# Patient Record
Sex: Female | Born: 1970 | Race: Black or African American | Hispanic: No | Smoking: Former smoker
Health system: Southern US, Community
[De-identification: ages and names within clinical notes are randomized; demographics above are authoritative.]

## PROBLEM LIST (undated history)

## (undated) DIAGNOSIS — N840 Polyp of corpus uteri: Secondary | ICD-10-CM

## (undated) DIAGNOSIS — D649 Anemia, unspecified: Secondary | ICD-10-CM

## (undated) DIAGNOSIS — D219 Benign neoplasm of connective and other soft tissue, unspecified: Secondary | ICD-10-CM

## (undated) DIAGNOSIS — R9389 Abnormal findings on diagnostic imaging of other specified body structures: Secondary | ICD-10-CM

## (undated) DIAGNOSIS — N920 Excessive and frequent menstruation with regular cycle: Secondary | ICD-10-CM

## (undated) HISTORY — PX: KNEE SURGERY: SHX244

## (undated) HISTORY — DX: Abnormal findings on diagnostic imaging of other specified body structures: R93.89

## (undated) HISTORY — PX: WISDOM TOOTH EXTRACTION: SHX21

## (undated) HISTORY — DX: Polyp of corpus uteri: N84.0

## (undated) HISTORY — DX: Anemia, unspecified: D64.9

## (undated) HISTORY — DX: Benign neoplasm of connective and other soft tissue, unspecified: D21.9

## (undated) HISTORY — DX: Excessive and frequent menstruation with regular cycle: N92.0

## (undated) HISTORY — PX: TUBAL LIGATION: SHX77

---

## 2003-07-25 ENCOUNTER — Emergency Department (HOSPITAL_COMMUNITY): Admission: EM | Admit: 2003-07-25 | Discharge: 2003-07-25 | Payer: Self-pay | Admitting: Emergency Medicine

## 2004-06-14 ENCOUNTER — Emergency Department (HOSPITAL_COMMUNITY): Admission: EM | Admit: 2004-06-14 | Discharge: 2004-06-14 | Payer: Self-pay | Admitting: Emergency Medicine

## 2005-05-31 ENCOUNTER — Emergency Department (HOSPITAL_COMMUNITY): Admission: EM | Admit: 2005-05-31 | Discharge: 2005-05-31 | Payer: Self-pay | Admitting: Emergency Medicine

## 2010-04-08 ENCOUNTER — Other Ambulatory Visit: Admission: RE | Admit: 2010-04-08 | Discharge: 2010-04-08 | Payer: Self-pay | Admitting: Obstetrics and Gynecology

## 2010-04-10 ENCOUNTER — Ambulatory Visit (HOSPITAL_COMMUNITY): Admission: RE | Admit: 2010-04-10 | Discharge: 2010-04-10 | Payer: Self-pay | Admitting: Obstetrics and Gynecology

## 2012-02-05 ENCOUNTER — Other Ambulatory Visit: Payer: Self-pay | Admitting: Obstetrics and Gynecology

## 2012-02-05 ENCOUNTER — Other Ambulatory Visit (HOSPITAL_COMMUNITY): Payer: Self-pay | Admitting: *Deleted

## 2012-02-05 DIAGNOSIS — Z1231 Encounter for screening mammogram for malignant neoplasm of breast: Secondary | ICD-10-CM

## 2012-02-08 ENCOUNTER — Ambulatory Visit (HOSPITAL_COMMUNITY)
Admission: RE | Admit: 2012-02-08 | Discharge: 2012-02-08 | Disposition: A | Discharge status: Home / Self Care | Payer: Self-pay | Source: Ambulatory Visit | Attending: Obstetrics and Gynecology | Admitting: Obstetrics and Gynecology

## 2012-02-08 DIAGNOSIS — Z1231 Encounter for screening mammogram for malignant neoplasm of breast: Secondary | ICD-10-CM

## 2013-10-08 ENCOUNTER — Emergency Department (HOSPITAL_COMMUNITY)
Admission: EM | Admit: 2013-10-08 | Discharge: 2013-10-08 | Disposition: A | Discharge status: Home / Self Care | Payer: Self-pay | Attending: Emergency Medicine | Admitting: Emergency Medicine

## 2013-10-08 ENCOUNTER — Encounter (HOSPITAL_COMMUNITY): Payer: Self-pay | Admitting: Emergency Medicine

## 2013-10-08 DIAGNOSIS — Z87891 Personal history of nicotine dependence: Secondary | ICD-10-CM | POA: Insufficient documentation

## 2013-10-08 DIAGNOSIS — J069 Acute upper respiratory infection, unspecified: Secondary | ICD-10-CM | POA: Insufficient documentation

## 2013-10-08 NOTE — Discharge Instructions (Signed)
Antibiotic Nonuse   Your caregiver felt that the infection or problem was not one that would be helped with an antibiotic.  Infections may be caused by viruses or bacteria. Only a caregiver can tell which one of these is the likely cause of an illness. A cold is the most common cause of infection in both adults and children. A cold is a virus. Antibiotic treatment will have no effect on a viral infection. Viruses can lead to many lost days of work caring for sick children and many missed days of school. Children may catch as many as 10 "colds" or "flus" per year during which they can be tearful, cranky, and uncomfortable. The goal of treating a virus is aimed at keeping the ill person comfortable.  Antibiotics are medications used to help the body fight bacterial infections. There are relatively few types of bacteria that cause infections but there are hundreds of viruses. While both viruses and bacteria cause infection they are very different types of germs. A viral infection will typically go away by itself within 7 to 10 days. Bacterial infections may spread or get worse without antibiotic treatment.  Examples of bacterial infections are:   Sore throats (like strep throat or tonsillitis).   Infection in the lung (pneumonia).   Ear and skin infections.  Examples of viral infections are:   Colds or flus.   Most coughs and bronchitis.   Sore throats not caused by Strep.   Runny noses.  It is often best not to take an antibiotic when a viral infection is the cause of the problem. Antibiotics can kill off the helpful bacteria that we have inside our body and allow harmful bacteria to start growing. Antibiotics can cause side effects such as allergies, nausea, and diarrhea without helping to improve the symptoms of the viral infection. Additionally, repeated uses of antibiotics can cause bacteria inside of our body to become resistant. That resistance can be passed onto harmful bacterial. The next time you have  an infection it may be harder to treat if antibiotics are used when they are not needed. Not treating with antibiotics allows our own immune system to develop and take care of infections more efficiently. Also, antibiotics will work better for us when they are prescribed for bacterial infections.  Treatments for a child that is ill may include:   Give extra fluids throughout the day to stay hydrated.   Get plenty of rest.   Only give your child over-the-counter or prescription medicines for pain, discomfort, or fever as directed by your caregiver.   The use of a cool mist humidifier may help stuffy noses.   Cold medications if suggested by your caregiver.  Your caregiver may decide to start you on an antibiotic if:   The problem you were seen for today continues for a longer length of time than expected.   You develop a secondary bacterial infection.  SEEK MEDICAL CARE IF:   Fever lasts longer than 5 days.   Symptoms continue to get worse after 5 to 7 days or become severe.   Difficulty in breathing develops.   Signs of dehydration develop (poor drinking, rare urinating, dark colored urine).   Changes in behavior or worsening tiredness (listlessness or lethargy).  Document Released: 11/02/2001 Document Revised: 11/16/2011 Document Reviewed: 05/01/2009  [employer] Patient Information 2014 [employer].

## 2013-10-08 NOTE — ED Notes (Signed)
Cough, sore throat, chills for 4 days. Worried that she has either "walking pneumonia or strep"

## 2013-10-08 NOTE — ED Provider Notes (Signed)
CSN: [id number]     Arrival date & time 10/08/13  0619 History   First MD Initiated Contact with Patient 10/08/13 [phone]     Chief Complaint  Patient presents with  . Sore Throat    Patient is a 43 y.o. female presenting with pharyngitis. The history is provided by the patient.  Sore Throat This is a new problem. The current episode started more than 2 days ago. The problem occurs daily. The problem has been gradually worsening. Pertinent negatives include no chest pain and no shortness of breath. The symptoms are aggravated by coughing. The symptoms are relieved by rest.    PMH - none History reviewed. No pertinent past surgical history. No family history on file. History  Substance Use Topics  . Smoking status: Former Smoker    Quit date: 10/21/2010  . Smokeless tobacco: Not on file  . Alcohol Use: Not on file   OB History   Grav Para Term Preterm Abortions TAB SAB Ect Mult Living                 Review of Systems  Constitutional: Positive for chills. Negative for fever.  Respiratory: Negative for shortness of breath.   Cardiovascular: Negative for chest pain.  Gastrointestinal: Negative for vomiting.    Allergies  Review of patient's allergies indicates not on file.  Home Medications  No current outpatient prescriptions on file. BP 118/46  Pulse 87  Temp(Src) 98.5 F (36.9 C) (Oral)  Resp 16  Ht 5\' 1"  (1.549 m)  Wt 225 lb (102.059 kg)  BMI 42.54 kg/m2  SpO2 100%  LMP 09/20/2012 Physical Exam CONSTITUTIONAL: Well developed/well nourished HEAD: Normocephalic/atraumatic EYES: EOMI/PERRL ENMT: Mucous membranes moist, uvula midline, no pharyngeal exudates, no erythema noted, normal phonation Left TM/right TM clear/intact NECK: supple no meningeal signs SPINE:entire spine nontender CV: S1/S2 noted, no murmurs/rubs/gallops noted LUNGS: Lungs are clear to auscultation bilaterally, no apparent distress ABDOMEN: soft, nontender, no rebound or guarding NEURO: Pt is  awake/alert, moves all extremitiesx4 EXTREMITIES: pulses normal, full ROM SKIN: warm, color normal PSYCH: no abnormalities of mood noted  ED Course  Procedures (including critical care time) Labs Review Labs Reviewed - No data to display Imaging Review No results found.  EKG Interpretation   None       MDM   1. URI (upper respiratory infection)    Nursing notes including past medical history and social history reviewed and considered in documentation  Pt well appearing, no distress, stable for d/c home    Sharyon Cable, MD 10/08/13 [phone]

## 2013-12-21 ENCOUNTER — Emergency Department (HOSPITAL_COMMUNITY): Payer: Self-pay

## 2013-12-21 ENCOUNTER — Encounter (HOSPITAL_COMMUNITY): Payer: Self-pay | Admitting: Emergency Medicine

## 2013-12-21 ENCOUNTER — Emergency Department (HOSPITAL_COMMUNITY)
Admission: EM | Admit: 2013-12-21 | Discharge: 2013-12-21 | Disposition: A | Discharge status: Home / Self Care | Payer: Self-pay | Attending: Emergency Medicine | Admitting: Emergency Medicine

## 2013-12-21 DIAGNOSIS — S20211A Contusion of right front wall of thorax, initial encounter: Secondary | ICD-10-CM

## 2013-12-21 DIAGNOSIS — S20219A Contusion of unspecified front wall of thorax, initial encounter: Secondary | ICD-10-CM | POA: Insufficient documentation

## 2013-12-21 DIAGNOSIS — S8000XA Contusion of unspecified knee, initial encounter: Secondary | ICD-10-CM | POA: Insufficient documentation

## 2013-12-21 DIAGNOSIS — Z87891 Personal history of nicotine dependence: Secondary | ICD-10-CM | POA: Insufficient documentation

## 2013-12-21 DIAGNOSIS — Z9889 Other specified postprocedural states: Secondary | ICD-10-CM | POA: Insufficient documentation

## 2013-12-21 DIAGNOSIS — Y92410 Unspecified street and highway as the place of occurrence of the external cause: Secondary | ICD-10-CM | POA: Insufficient documentation

## 2013-12-21 DIAGNOSIS — Y9389 Activity, other specified: Secondary | ICD-10-CM | POA: Insufficient documentation

## 2013-12-21 DIAGNOSIS — S8001XA Contusion of right knee, initial encounter: Secondary | ICD-10-CM

## 2013-12-21 MED ORDER — IBUPROFEN 800 MG PO TABS
800.0000 mg | ORAL_TABLET | Freq: Once | ORAL | Status: AC
  Administered 2013-12-21: 800 mg via ORAL
  Filled 2013-12-21: qty 1

## 2013-12-21 MED ORDER — CYCLOBENZAPRINE HCL 10 MG PO TABS: 10.0000 mg | ORAL_TABLET | Freq: Three times a day (TID) | ORAL | Status: DC | PRN

## 2013-12-21 MED ORDER — NAPROXEN 500 MG PO TABS: 500.0000 mg | ORAL_TABLET | Freq: Two times a day (BID) | ORAL | Status: DC

## 2013-12-21 MED ORDER — CYCLOBENZAPRINE HCL 10 MG PO TABS
10.0000 mg | ORAL_TABLET | Freq: Once | ORAL | Status: AC
  Administered 2013-12-21: 10 mg via ORAL
  Filled 2013-12-21: qty 1
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
## 2013-12-21 NOTE — Discharge Instructions (Signed)
Ice packs to the painful or bruised areas. Take the medications as prescribed. Return to the ED for any problems listed on the head injury sheet or if you feel worse such as difficulty breathing. Have Dr Luna Glasgow, the orthopedist on call, recheck your knee if you continue to have pain after the next week.

## 2013-12-21 NOTE — ED Notes (Signed)
Patient complaining of pain to right breast area at this time. Complains of pain to right knee. Patient ambulated to bathroom and tolerated well

## 2014-07-23 ENCOUNTER — Ambulatory Visit (INDEPENDENT_AMBULATORY_CARE_PROVIDER_SITE_OTHER): Payer: BC Managed Care – PPO | Admitting: Adult Health

## 2014-07-23 ENCOUNTER — Other Ambulatory Visit: Payer: Self-pay | Admitting: Adult Health

## 2014-07-23 ENCOUNTER — Encounter: Payer: Self-pay | Admitting: Adult Health

## 2014-07-23 ENCOUNTER — Other Ambulatory Visit (HOSPITAL_COMMUNITY)
Admission: RE | Admit: 2014-07-23 | Discharge: 2014-07-23 | Disposition: A | Discharge status: Home / Self Care | Payer: BC Managed Care – PPO | Source: Ambulatory Visit | Attending: Adult Health | Admitting: Adult Health

## 2014-07-23 VITALS — BP 100/70 | HR 78 | Ht 61.5 in | Wt 214.0 lb

## 2014-07-23 DIAGNOSIS — Z01419 Encounter for gynecological examination (general) (routine) without abnormal findings: Secondary | ICD-10-CM

## 2014-07-23 DIAGNOSIS — Z1231 Encounter for screening mammogram for malignant neoplasm of breast: Secondary | ICD-10-CM

## 2014-07-23 DIAGNOSIS — Z1212 Encounter for screening for malignant neoplasm of rectum: Secondary | ICD-10-CM

## 2014-07-23 DIAGNOSIS — Z113 Encounter for screening for infections with a predominantly sexual mode of transmission: Secondary | ICD-10-CM | POA: Diagnosis present

## 2014-07-23 DIAGNOSIS — Z1151 Encounter for screening for human papillomavirus (HPV): Secondary | ICD-10-CM | POA: Insufficient documentation

## 2014-07-23 LAB — COMPREHENSIVE METABOLIC PANEL
ALT: 12 U/L (ref 0–35)
AST: 16 U/L (ref 0–37)
Albumin: 3.6 g/dL (ref 3.5–5.2)
Alkaline Phosphatase: 40 U/L (ref 39–117)
BUN: 8 mg/dL (ref 6–23)
CO2: 22 mEq/L (ref 19–32)
Calcium: 8.8 mg/dL (ref 8.4–10.5)
Chloride: 108 mEq/L (ref 96–112)
Creat: 0.6 mg/dL (ref 0.50–1.10)
Glucose, Bld: 91 mg/dL (ref 70–99)
Potassium: 4.1 mEq/L (ref 3.5–5.3)
Sodium: 138 mEq/L (ref 135–145)
Total Bilirubin: 0.3 mg/dL (ref 0.2–1.2)
Total Protein: 6.8 g/dL (ref 6.0–8.3)

## 2014-07-23 LAB — LIPID PANEL
Cholesterol: 128 mg/dL (ref 0–200)
HDL: 52 mg/dL (ref >39)
LDL Cholesterol: 62 mg/dL (ref 0–99)
Total CHOL/HDL Ratio: 2.5 Ratio
Triglycerides: 69 mg/dL (ref <150)
VLDL: 14 mg/dL (ref 0–40)

## 2014-07-23 LAB — CBC
HCT: 23.5 % — ABNORMAL LOW (ref 36.0–46.0)
Hemoglobin: 6.8 g/dL — CL (ref 12.0–15.0)
MCH: 17.2 pg — ABNORMAL LOW (ref 26.0–34.0)
MCHC: 28.9 g/dL — ABNORMAL LOW (ref 30.0–36.0)
MCV: 59.5 fL — ABNORMAL LOW (ref 78.0–100.0)
Platelets: 315 10*3/uL (ref 150–400)
RBC: 3.95 MIL/uL (ref 3.87–5.11)
RDW: 18.9 % — ABNORMAL HIGH (ref 11.5–15.5)
WBC: 4.1 10*3/uL (ref 4.0–10.5)

## 2014-07-23 LAB — HEMOCCULT GUIAC POC 1CARD (OFFICE): Fecal Occult Blood, POC: NEGATIVE

## 2014-07-23 LAB — TSH: TSH: 1.631 u[IU]/mL (ref 0.350–4.500)

## 2014-07-23 NOTE — Patient Instructions (Signed)
Physical in 1 year Mammogram now

## 2014-07-23 NOTE — Progress Notes (Signed)
Patient ID: Jodi Chaney, female   DOB: 1971-08-19, 43 y.o.   MRN: [id number] History of Present Illness: Jodi Chaney is a 43 year old black female, single in for pap and physical.No complaints.   Current Medications, Allergies, Past Medical History, Past Surgical History, Family History and Social History were reviewed in [employer] record.     Review of Systems: Patient denies any headaches, blurred vision, shortness of breath, chest pain, abdominal pain, problems with bowel movements, urination, or intercourse. No joint pain or mood swings.    Physical Exam:BP 100/70 mmHg  Pulse 78  Ht 5' 1.5" (1.562 m)  Wt 214 lb (97.07 kg)  BMI 39.79 kg/m2  LMP 07/08/2014 General:  Well developed, well nourished, no acute distress Skin:  Warm and dry Neck:  Midline trachea, normal thyroid Lungs; Clear to auscultation bilaterally Breast:  No dominant palpable mass, retraction, or nipple discharge Cardiovascular: Regular rate and rhythm Abdomen:  Soft, non tender, no hepatosplenomegaly Pelvic:  External genitalia is normal in appearance.  The vagina is normal in appearance.  The cervix is bulbous.pap with HPV and GC/chl performed.  Uterus is felt to be normal size, shape, and contour.  No    adnexal masses or tenderness noted. Rectal: Good sphincter tone, no polyps, or hemorrhoids felt.  Hemoccult negative. Extremities:  No swelling or varicosities noted Psych:  No mood changes,alert and cooperative,seems happy   Impression: Well woman gyn exam    Plan: Check CBC,CMP,TSH and lipids Physical in 1 year Mammogram now and yearly, number given to xray  Declines flu shot

## 2014-07-25 ENCOUNTER — Telehealth: Payer: Self-pay | Admitting: Adult Health

## 2014-07-25 LAB — CYTOLOGY - PAP

## 2014-07-25 NOTE — Telephone Encounter (Signed)
Not available

## 2014-07-26 ENCOUNTER — Telehealth: Payer: Self-pay | Admitting: Adult Health

## 2014-07-26 NOTE — Telephone Encounter (Signed)
Pt aware of labs and that hgb 6.8 needs iron infusion will arrange, she says periods not bad but last 5 days and 2&3 days a little heavy changes every 2-3 hours, will give megace after [place].

## 2014-07-27 ENCOUNTER — Telehealth: Payer: Self-pay | Admitting: Adult Health

## 2014-07-27 MED ORDER — MEGESTROL ACETATE 40 MG PO TABS: 40.0000 mg | ORAL_TABLET | Freq: Every day | ORAL | Status: DC

## 2014-07-27 NOTE — Telephone Encounter (Signed)
Pt aware megace called in and that hospital will call her for IV iron

## 2014-07-30 ENCOUNTER — Ambulatory Visit (HOSPITAL_COMMUNITY)
Admission: RE | Admit: 2014-07-30 | Discharge: 2014-07-30 | Disposition: A | Discharge status: Home / Self Care | Payer: BC Managed Care – PPO | Source: Ambulatory Visit | Attending: Adult Health | Admitting: Adult Health

## 2014-07-30 DIAGNOSIS — Z1231 Encounter for screening mammogram for malignant neoplasm of breast: Secondary | ICD-10-CM | POA: Insufficient documentation

## 2014-07-31 ENCOUNTER — Telehealth: Payer: Self-pay | Admitting: Adult Health

## 2014-07-31 NOTE — Telephone Encounter (Signed)
No answer but I called [facility] and she has appt 11/25 at 2 pm for IV iron, make appt in 2 weeks for labs

## 2014-08-01 ENCOUNTER — Encounter (HOSPITAL_COMMUNITY)
Admission: RE | Admit: 2014-08-01 | Discharge: 2014-08-01 | Disposition: A | Discharge status: Home / Self Care | Payer: BC Managed Care – PPO | Source: Ambulatory Visit | Attending: Obstetrics and Gynecology | Admitting: Obstetrics and Gynecology

## 2014-08-01 ENCOUNTER — Encounter (HOSPITAL_COMMUNITY): Payer: Self-pay

## 2014-08-01 DIAGNOSIS — D649 Anemia, unspecified: Secondary | ICD-10-CM | POA: Insufficient documentation

## 2014-08-01 MED ORDER — SODIUM CHLORIDE 0.9 % IV SOLN
Freq: Once | INTRAVENOUS | Status: AC
  Administered 2014-08-01: 250 mL via INTRAVENOUS

## 2014-08-01 MED ORDER — SODIUM CHLORIDE 0.9 % IV SOLN
1020.0000 mg | Freq: Once | INTRAVENOUS | Status: AC
  Administered 2014-08-01: 1020 mg via INTRAVENOUS
  Filled 2014-08-01: qty 34

## 2014-08-24 ENCOUNTER — Encounter: Payer: Self-pay | Admitting: Adult Health

## 2014-08-24 ENCOUNTER — Ambulatory Visit (INDEPENDENT_AMBULATORY_CARE_PROVIDER_SITE_OTHER): Payer: BC Managed Care – PPO | Admitting: Adult Health

## 2014-08-24 VITALS — BP 110/70 | Ht 61.5 in | Wt 216.0 lb

## 2014-08-24 DIAGNOSIS — D649 Anemia, unspecified: Secondary | ICD-10-CM | POA: Insufficient documentation

## 2014-08-24 DIAGNOSIS — D500 Iron deficiency anemia secondary to blood loss (chronic): Secondary | ICD-10-CM

## 2014-08-24 DIAGNOSIS — N920 Excessive and frequent menstruation with regular cycle: Secondary | ICD-10-CM | POA: Insufficient documentation

## 2014-08-24 HISTORY — DX: Excessive and frequent menstruation with regular cycle: N92.0

## 2014-08-24 NOTE — Progress Notes (Signed)
Subjective:     Patient ID: Jodi Chaney, female   DOB: 1970/10/27, 43 y.o.   MRN: [id number]  HPI Jodi Chaney is a 43 year old black female in for follow up of getting [facility] 11/25 for HGB 6.8 on 11/16.She is taking megace and has not had any more bleeding.She is eating greens and red meats and liver.  Review of Systems See HPI Reviewed past medical,surgical, social and family history. Reviewed medications and allergies.     Objective:   Physical Exam BP 110/70 mmHg  Ht 5' 1.5" (1.562 m)  Wt 216 lb (97.977 kg)  BMI 40.16 kg/m2  LMP 07/10/2014   Feels much better, no bleeding, (does have some pain left knee area is sp surgery years ago, to call Dr Aline Brochure)  Assessment:     Anemia Menorrhagia     Plan:     Continue megace Check CBC Return in 4 weeks for [place] and see me

## 2014-08-24 NOTE — Patient Instructions (Signed)
Follow up in 4 weeks for [place] and see me Continue megace

## 2014-08-25 LAB — CBC
HCT: 34.2 % — ABNORMAL LOW (ref 36.0–46.0)
Hemoglobin: 10.3 g/dL — ABNORMAL LOW (ref 12.0–15.0)
MCH: 22.1 pg — ABNORMAL LOW (ref 26.0–34.0)
MCHC: 30.1 g/dL (ref 30.0–36.0)
MCV: 73.2 fL — ABNORMAL LOW (ref 78.0–100.0)
Platelets: 356 10*3/uL (ref 150–400)
RBC: 4.67 MIL/uL (ref 3.87–5.11)
WBC: 4 10*3/uL (ref 4.0–10.5)

## 2014-08-27 ENCOUNTER — Telehealth: Payer: Self-pay | Admitting: Adult Health

## 2014-08-27 NOTE — Telephone Encounter (Signed)
Pt aware HGB 10.3 which is much better, but take OTC iron tab, too and continue megace and eating iron rich foods

## 2014-09-21 ENCOUNTER — Other Ambulatory Visit: Payer: BC Managed Care – PPO

## 2014-09-21 ENCOUNTER — Ambulatory Visit: Payer: BC Managed Care – PPO | Admitting: Adult Health

## 2014-10-02 ENCOUNTER — Telehealth: Payer: Self-pay | Admitting: Adult Health

## 2014-10-02 NOTE — Telephone Encounter (Signed)
Missed [place] appt and is spotting on megace, keep taking megace and reschedule [place] appt and see me

## 2014-10-04 ENCOUNTER — Encounter: Payer: Self-pay | Admitting: Adult Health

## 2014-10-04 ENCOUNTER — Ambulatory Visit (INDEPENDENT_AMBULATORY_CARE_PROVIDER_SITE_OTHER): Payer: BLUE CROSS/BLUE SHIELD | Admitting: Adult Health

## 2014-10-04 ENCOUNTER — Other Ambulatory Visit: Payer: Self-pay | Admitting: Adult Health

## 2014-10-04 ENCOUNTER — Ambulatory Visit (INDEPENDENT_AMBULATORY_CARE_PROVIDER_SITE_OTHER): Payer: BLUE CROSS/BLUE SHIELD

## 2014-10-04 VITALS — BP 110/60 | Ht 61.0 in | Wt 227.0 lb

## 2014-10-04 DIAGNOSIS — R9389 Abnormal findings on diagnostic imaging of other specified body structures: Secondary | ICD-10-CM

## 2014-10-04 DIAGNOSIS — N920 Excessive and frequent menstruation with regular cycle: Secondary | ICD-10-CM

## 2014-10-04 DIAGNOSIS — R938 Abnormal findings on diagnostic imaging of other specified body structures: Secondary | ICD-10-CM

## 2014-10-04 DIAGNOSIS — D219 Benign neoplasm of connective and other soft tissue, unspecified: Secondary | ICD-10-CM

## 2014-10-04 DIAGNOSIS — D500 Iron deficiency anemia secondary to blood loss (chronic): Secondary | ICD-10-CM

## 2014-10-04 DIAGNOSIS — D259 Leiomyoma of uterus, unspecified: Secondary | ICD-10-CM

## 2014-10-04 DIAGNOSIS — N840 Polyp of corpus uteri: Secondary | ICD-10-CM

## 2014-10-04 HISTORY — DX: Abnormal findings on diagnostic imaging of other specified body structures: R93.89

## 2014-10-04 HISTORY — DX: Polyp of corpus uteri: N84.0

## 2014-10-04 HISTORY — DX: Benign neoplasm of connective and other soft tissue, unspecified: D21.9

## 2014-10-04 NOTE — Progress Notes (Signed)
Subjective:     Patient ID: Jodi Chaney, female   DOB: August 26, 1971, 44 y.o.   MRN: [id number]  HPI Bracha is a 44 year old black female in for [place] has had menorrhagia and anemia.Has had iron infusion and taking megace.Had stopped taking megace after bleeding stopped and started bleeding again,she forgot she had refills and was supposed to take.   Review of Systems See HPI Reviewed past medical,surgical, social and family history. Reviewed medications and allergies.     Objective:   Physical Exam BP 110/60 mmHg  Ht 5\' 1"  (9.169 m)  Wt 227 lb (102.967 kg)  BMI 42.91 kg/m2Reviewed [place] with pt.   Uterus 10.1 x 7.5 x 6.8 cm, retroverted with multiple fibroids noted largest=3.2cm  Endometrium * 22.22 mm, asymmetrical, distorted by fibroids, thickened and hyperechoic area noted within ?polyp (+Doppler flow noted)=1.7 x1.3cm  Right ovary 2.7 x 1.9 x 1.6 cm,   Left ovary 2.9 x 2.3 x 1.6 cm,   No free fluid or adnexal masses noted within the pelvis  Technician Comments:  Retroverted uterus with multiple fibroids noted, thickened endometrium with a hyperechoic area noted within ?polyp, bilateral adnexa/ovaries appears WNL, no free fluid noted within the pelvis Discussed with Dr Elonda Husky will schedule pre op with him for hysteroscopy D&C with ablation, pt agrees.  Assessment:    Menorrhagia Anemia Thickened endometrium ? Polyp Fibroids      Plan:     Check CBC Continue megace Return in 2 weeks for pre op with Dr Elonda Husky Review handout on abaltion

## 2014-10-04 NOTE — Patient Instructions (Signed)
Continue megace Return in 2 weeks for pre op with Dr Elonda Husky

## 2014-10-05 ENCOUNTER — Telehealth: Payer: Self-pay | Admitting: Adult Health

## 2014-10-05 LAB — CBC
HCT: 37.7 % (ref 34.0–46.6)
Hemoglobin: 12.4 g/dL (ref 11.1–15.9)
MCH: 25.6 pg — ABNORMAL LOW (ref 26.6–33.0)
MCHC: 32.9 g/dL (ref 31.5–35.7)
MCV: 78 fL — ABNORMAL LOW (ref 79–97)
Platelets: 227 x10E3/uL (ref 150–379)
RBC: 4.85 x10E6/uL (ref 3.77–5.28)
WBC: 4 x10E3/uL (ref 3.4–10.8)

## 2014-10-05 NOTE — Telephone Encounter (Signed)
Pt aware HBG 12.2

## 2014-10-18 ENCOUNTER — Encounter: Payer: Self-pay | Admitting: Obstetrics & Gynecology

## 2014-10-18 ENCOUNTER — Ambulatory Visit (INDEPENDENT_AMBULATORY_CARE_PROVIDER_SITE_OTHER): Payer: BLUE CROSS/BLUE SHIELD | Admitting: Obstetrics & Gynecology

## 2014-10-18 VITALS — BP 100/70 | Ht 61.0 in | Wt 230.0 lb

## 2014-10-18 DIAGNOSIS — D259 Leiomyoma of uterus, unspecified: Secondary | ICD-10-CM

## 2014-10-18 DIAGNOSIS — N920 Excessive and frequent menstruation with regular cycle: Secondary | ICD-10-CM

## 2014-10-18 DIAGNOSIS — N840 Polyp of corpus uteri: Secondary | ICD-10-CM

## 2014-10-18 MED ORDER — MEGESTROL ACETATE 40 MG PO TABS: 40.0000 mg | ORAL_TABLET | Freq: Every day | ORAL | Status: DC

## 2014-10-18 NOTE — Progress Notes (Signed)
Patient ID: Jodi Chaney, female   DOB: Feb 20, 1971, 44 y.o.   MRN: [id number] Preoperative History and Physical  Jodi Chaney is a 44 y.o. [phone] with No LMP recorded. admitted for a hysteroscopy uterine curettage NovaSure endometrial ablation removal of endometrial polyp.  Pt has endometrialpolyp vs myoma and long standing heavy crampy periods  PMH:    Past Medical History  Diagnosis Date  . Anemia   . Menorrhagia 08/24/2014  . Thickened endometrium 10/04/2014    ?polyp to see Dr Despina Hidden about [facility] and ablation  . Fibroids 10/04/2014  . Endometrial polyp 10/04/2014    PSH:     Past Surgical History  Procedure Laterality Date  . Cesarean section    . Knee surgery    . Tubal ligation      POb/GynH:      OB History    Gravida Para Term Preterm AB TAB SAB Ectopic Multiple Living   4 3   1 1    3       SH:   History  Substance Use Topics  . Smoking status: Former Smoker    Types: Cigarettes    Quit date: 10/21/2010  . Smokeless tobacco: Never Used  . Alcohol Use: Yes     Comment: occ    FH:    Family History  Problem Relation Age of Onset  . Diabetes Mother   . Hypertension Father   . Other Father     has had open heart surgery; has one kidney  . Hypertension Sister   . Diabetes Brother   . Anxiety disorder Daughter   . Bipolar disorder Son   . Schizophrenia Son   . Diabetes Maternal Grandmother   . Kidney disease Maternal Grandfather   . Cancer Paternal Grandmother     breast  . Cancer Sister     breast  . Diabetes Brother     borderline  . Seizures Son      Allergies: No Known Allergies  Medications:       Current outpatient prescriptions:  .  megestrol (MEGACE) 40 MG tablet, Take 1 tablet (40 mg total) by mouth daily., Disp: 30 tablet, Rfl: 3  Review of Systems:   Review of Systems  Constitutional: Negative for fever, chills, weight loss, malaise/fatigue and diaphoresis.  HENT: Negative for hearing loss, ear pain, nosebleeds,  congestion, sore throat, neck pain, tinnitus and ear discharge.   Eyes: Negative for blurred vision, double vision, photophobia, pain, discharge and redness.  Respiratory: Negative for cough, hemoptysis, sputum production, shortness of breath, wheezing and stridor.   Cardiovascular: Negative for chest pain, palpitations, orthopnea, claudication, leg swelling and PND.  Gastrointestinal: Positive for abdominal pain. Negative for heartburn, nausea, vomiting, diarrhea, constipation, blood in stool and melena.  Genitourinary: Negative for dysuria, urgency, frequency, hematuria and flank pain.  Musculoskeletal: Negative for myalgias, back pain, joint pain and falls.  Skin: Negative for itching and rash.  Neurological: Negative for dizziness, tingling, tremors, sensory change, speech change, focal weakness, seizures, loss of consciousness, weakness and headaches.  Endo/Heme/Allergies: Negative for environmental allergies and polydipsia. Does not bruise/bleed easily.  Psychiatric/Behavioral: Negative for depression, suicidal ideas, hallucinations, memory loss and substance abuse. The patient is not nervous/anxious and does not have insomnia.      PHYSICAL EXAM:  Blood pressure 100/70, height 5\' 1"  (1.549 m), weight 230 lb (104.327 kg).    Vitals reviewed. Constitutional: She is oriented to person, place, and time. She appears well-developed and well-nourished.  HENT:  Head: Normocephalic and atraumatic.  Right Ear: External ear normal.  Left Ear: External ear normal.  Nose: Nose normal.  Mouth/Throat: Oropharynx is clear and moist.  Eyes: Conjunctivae and EOM are normal. Pupils are equal, round, and reactive to light. Right eye exhibits no discharge. Left eye exhibits no discharge. No scleral icterus.  Neck: Normal range of motion. Neck supple. No tracheal deviation present. No thyromegaly present.  Cardiovascular: Normal rate, regular rhythm, normal heart sounds and intact distal pulses.  Exam  reveals no gallop and no friction rub.   No murmur heard. Respiratory: Effort normal and breath sounds normal. No respiratory distress. She has no wheezes. She has no rales. She exhibits no tenderness.  GI: Soft. Bowel sounds are normal. She exhibits no distension and no mass. There is tenderness. There is no rebound and no guarding.  Genitourinary:       Vulva is normal without lesions Vagina is pink moist without discharge Cervix normal in appearance and pap is normal Uterus is significant for small myomas, endometrial mass Adnexa is negative with normal sized ovaries by sonogram  Musculoskeletal: Normal range of motion. She exhibits no edema and no tenderness.  Neurological: She is alert and oriented to person, place, and time. She has normal reflexes. She displays normal reflexes. No cranial nerve deficit. She exhibits normal muscle tone. Coordination normal.  Skin: Skin is warm and dry. No rash noted. No erythema. No pallor.  Psychiatric: She has a normal mood and affect. Her behavior is normal. Judgment and thought content normal.    Labs: No results found for this or any previous visit (from the past 336 hour(s)).  EKG: No orders found for this or any previous visit.  Imaging Studies: US Transvaginal Non-ob  24-Oct-2014   GYNECOLOGIC SONOGRAM   Jodi Chaney is a 44 y.o. [phone]  for a pelvic sonogram for  menorrhagia with iron deficiency. Pt is currently taking Megace.  Uterus                      10.1 x 7.5 x 6.8 cm, retroverted with multiple  fibroids noted largest=3.2cm  Endometrium          * 22.22 mm, asymmetrical, distorted by fibroids,  thickened and hyperechoic area noted within ?polyp (+Doppler flow  noted)=1.7 x1.3cm  Right ovary             2.7 x 1.9 x 1.6 cm,   Left ovary                2.9 x 2.3 x 1.6 cm,   No free fluid or adnexal masses noted within the pelvis  Technician Comments:  Retroverted uterus with multiple fibroids noted, thickened endometrium  with a  hyperechoic area noted within ?polyp, bilateral adnexa/ovaries  appears WNL, no free fluid noted within the pelvis   Chari Manning 10/04/2014 9:46 AM  Clinical Impression and recommendations:  I have reviewed the sonogram results above, combined with the patient's  current clinical course, below are my impressions and any appropriate  recommendations for management based on the sonographic findings.  Thickened endometrium secondary to megestrol therapy Otherwise relatively normal uterus, small myomas Normal ovaries   Anakin Varkey H 10-24-2014 6:22 PM        Assessment: Patient Active Problem List   Diagnosis Date Noted  . Thickened endometrium 10/04/2014  . Fibroids 10/04/2014  . Endometrial polyp 10/04/2014  . Anemia 08/24/2014  . Menorrhagia 08/24/2014    Plan: Hysteroscopy uterine  curettage endometrial ablation NovaSure  Lazaro Arms 10/18/2014 3:38 PM

## 2014-10-19 NOTE — Patient Instructions (Signed)
Toba MYLDRED RAJU  10/19/2014   Your procedure is scheduled on:  10/24/2014  Report to [facility] at 1000  AM.  Call this number if you have problems the morning of surgery: [phone]   Remember:   Do not eat food or drink liquids after midnight.   Take these medicines the morning of surgery with A SIP OF WATER:  none   Do not wear jewelry, make-up or nail polish.  Do not wear lotions, powders, or perfumes.   Do not shave 48 hours prior to surgery. Men may shave face and neck.  Do not bring valuables to the hospital.  [facility] is not responsible for any belongings or valuables.               Contacts, dentures or bridgework may not be worn into surgery.  Leave suitcase in the car. After surgery it may be brought to your room.  For patients admitted to the hospital, discharge time is determined by your treatment team.               Patients discharged the day of surgery will not be allowed to drive home.  Name and phone number of your driver: family  Special Instructions: Shower using CHG 2 nights before surgery and the night before surgery.  If you shower the day of surgery use CHG.  Use special wash - you have one bottle of CHG for all showers.  You should use approximately 1/3 of the bottle for each shower.   Please read over the following fact sheets that you were given: Pain Booklet, Coughing and Deep Breathing, Surgical Site Infection Prevention, Anesthesia Post-op Instructions and Care and Recovery After Surgery Endometrial Ablation Endometrial ablation removes the lining of the uterus (endometrium). It is usually a same-day, outpatient treatment. Ablation helps avoid major surgery, such as surgery to remove the cervix and uterus (hysterectomy). After endometrial ablation, you will have little or no menstrual bleeding and may not be able to have children. However, if you are premenopausal, you will need to use a reliable method of birth control following the  procedure because of the small chance that pregnancy can occur. There are different reasons to have this procedure, which include:  Heavy periods.  Bleeding that is causing anemia.  Irregular bleeding.  Bleeding fibroids on the lining inside the uterus if they are smaller than 3 centimeters. This procedure should not be done if:  You want children in the future.  You have severe cramps with your menstrual period.  You have precancerous or cancerous cells in your uterus.  You were recently pregnant.  You have gone through menopause.  You have had major surgery on the uterus, such as a cesarean delivery. LET [facility] CARE PROVIDER KNOW ABOUT:  Any allergies you have.  All medicines you are taking, including vitamins, herbs, eye drops, creams, and over-the-counter medicines.  Previous problems you or members of your family have had with the use of anesthetics.  Any blood disorders you have.  Previous surgeries you have had.  Medical conditions you have. RISKS AND COMPLICATIONS  Generally, this is a safe procedure. However, as with any procedure, complications can occur. Possible complications include:  Perforation of the uterus.  Bleeding.  Infection of the uterus, bladder, or vagina.  Injury to surrounding organs.  An air bubble to the lung (air embolus).  Pregnancy following the procedure.  Failure of the procedure to help the  problem, requiring hysterectomy.  Decreased ability to diagnose cancer in the lining of the uterus. BEFORE THE PROCEDURE  The lining of the uterus must be tested to make sure there is no pre-cancerous or cancer cells present.  An ultrasound may be performed to look at the size of the uterus and to check for abnormalities.  Medicines may be given to thin the lining of the uterus. PROCEDURE  During the procedure, your health care provider will use a tool called a resectoscope to help see inside your uterus. There are different ways  to remove the lining of your uterus.   Radiofrequency - This method uses a radiofrequency-alternating electric current to remove the lining of the uterus.  Cryotherapy - This method uses extreme cold to freeze the lining of the uterus.  Heated-Free Liquid - This method uses heated salt (saline) solution to remove the lining of the uterus.  Microwave - This method uses high-energy microwaves to heat up the lining of the uterus to remove it.  Thermal balloon - This method involves inserting a catheter with a balloon tip into the uterus. The balloon tip is filled with heated fluid to remove the lining of the uterus. AFTER THE PROCEDURE  After your procedure, do not have sexual intercourse or insert anything into your vagina until permitted by your health care provider. After the procedure, you may experience:  Cramps.  Vaginal discharge.  Frequent urination. Document Released: 07/03/2004 Document Revised: 04/26/2013 Document Reviewed: 01/25/2013 [facility] Patient Information 2015 [place], [place]. This information is not intended to replace advice given to you by your health care provider. Make sure you discuss any questions you have with your health care provider. Hysteroscopy Hysteroscopy is a procedure used for looking inside the womb (uterus). It may be done for various reasons, including:  To evaluate abnormal bleeding, fibroid (benign, noncancerous) tumors, polyps, scar tissue (adhesions), and possibly cancer of the uterus.  To look for lumps (tumors) and other uterine growths.  To look for causes of why a woman cannot get pregnant (infertility), causes of recurrent loss of pregnancy (miscarriages), or a lost intrauterine device (IUD).  To perform a sterilization by blocking the fallopian tubes from inside the uterus. In this procedure, a thin, flexible tube with a tiny light and camera on the end of it (hysteroscope) is used to look inside the uterus. A hysteroscopy should be done  right after a menstrual period to be sure you are not pregnant. LET [facility] CARE PROVIDER KNOW ABOUT:   Any allergies you have.  All medicines you are taking, including vitamins, herbs, eye drops, creams, and over-the-counter medicines.  Previous problems you or members of your family have had with the use of anesthetics.  Any blood disorders you have.  Previous surgeries you have had.  Medical conditions you have. RISKS AND COMPLICATIONS  Generally, this is a safe procedure. However, as with any procedure, complications can occur. Possible complications include:  Putting a hole in the uterus.  Excessive bleeding.  Infection.  Damage to the cervix.  Injury to other organs.  Allergic reaction to medicines.  Too much fluid used in the uterus for the procedure. BEFORE THE PROCEDURE   Ask your health care provider about changing or stopping any regular medicines.  Do not take aspirin or blood thinners for 1 week before the procedure, or as directed by your health care provider. These can cause bleeding.  If you smoke, do not smoke for 2 weeks before the procedure.  In some  cases, a medicine is placed in the cervix the day before the procedure. This medicine makes the cervix have a larger opening (dilate). This makes it easier for the instrument to be inserted into the uterus during the procedure.  Do not eat or drink anything for at least 8 hours before the surgery.  Arrange for someone to take you home after the procedure. PROCEDURE   You may be given a medicine to relax you (sedative). You may also be given one of the following:  A medicine that numbs the area around the cervix (local anesthetic).  A medicine that makes you sleep through the procedure (general anesthetic).  The hysteroscope is inserted through the vagina into the uterus. The camera on the hysteroscope sends a picture to a TV screen. This gives the surgeon a good view inside the uterus.  During  the procedure, air or a liquid is put into the uterus, which allows the surgeon to see better.  Sometimes, tissue is gently scraped from inside the uterus. These tissue samples are sent to a lab for testing. AFTER THE PROCEDURE   If you had a general anesthetic, you may be groggy for a couple hours after the procedure.  If you had a local anesthetic, you will be able to go home as soon as you are stable and feel ready.  You may have some cramping. This normally lasts for a couple days.  You may have bleeding, which varies from light spotting for a few days to menstrual-like bleeding for 3-7 days. This is normal.  If your test results are not back during the visit, make an appointment with your health care provider to find out the results. Document Released: 11/30/2000 Document Revised: 06/14/2013 Document Reviewed: 03/23/2013 [facility] Patient Information 2015 [place], [place]. This information is not intended to replace advice given to you by your health care provider. Make sure you discuss any questions you have with your health care provider. Dilation and Curettage or Vacuum Curettage Dilation and curettage (D&C) and vacuum curettage are minor procedures. A D&C involves stretching (dilation) the cervix and scraping (curettage) the inside lining of the womb (uterus). During a D&C, tissue is gently scraped from the inside lining of the uterus. During a vacuum curettage, the lining and tissue in the uterus are removed with the use of gentle suction.  Curettage may be performed to either diagnose or treat a problem. As a diagnostic procedure, curettage is performed to examine tissues from the uterus. A diagnostic curettage may be performed for the following symptoms:   Irregular bleeding in the uterus.   Bleeding with the development of clots.   Spotting between menstrual periods.   Prolonged menstrual periods.   Bleeding after menopause.   No menstrual period (amenorrhea).   A  change in size and shape of the uterus.  As a treatment procedure, curettage may be performed for the following reasons:   Removal of an IUD (intrauterine device).   Removal of retained placenta after giving birth. Retained placenta can cause an infection or bleeding severe enough to require transfusions.   Abortion.   Miscarriage.   Removal of polyps inside the uterus.   Removal of uncommon types of noncancerous lumps (fibroids).  LET [facility] CARE PROVIDER KNOW ABOUT:   Any allergies you have.   All medicines you are taking, including vitamins, herbs, eye drops, creams, and over-the-counter medicines.   Previous problems you or members of your family have had with the use of anesthetics.  Any blood disorders you have.   Previous surgeries you have had.   Medical conditions you have. RISKS AND COMPLICATIONS  Generally, this is a safe procedure. However, as with any procedure, complications can occur. Possible complications include:  Excessive bleeding.   Infection of the uterus.   Damage to the cervix.   Development of scar tissue (adhesions) inside the uterus, later causing abnormal amounts of menstrual bleeding.   Complications from the general anesthetic, if a general anesthetic is used.   Putting a hole (perforation) in the uterus. This is rare.  BEFORE THE PROCEDURE   Eat and drink before the procedure only as directed by your health care provider.   Arrange for someone to take you home.  PROCEDURE  This procedure usually takes about 15-30 minutes.  You will be given one of the following:  A medicine that numbs the area in and around the cervix (local anesthetic).   A medicine to make you sleep through the procedure (general anesthetic).  You will lie on your back with your legs in stirrups.   A warm metal or plastic instrument (speculum) will be placed in your vagina to keep it open and to allow the health care provider to see  the cervix.  There are two ways in which your cervix can be softened and dilated. These include:   Taking a medicine.   Having thin rods (laminaria) inserted into your cervix.   A curved tool (curette) will be used to scrape cells from the inside lining of the uterus. In some cases, gentle suction is applied with the curette. The curette will then be removed.  AFTER THE PROCEDURE   You will rest in the recovery area until you are stable and are ready to go home.   You may feel sick to your stomach (nauseous) or throw up (vomit) if you were given a general anesthetic.   You may have a sore throat if a tube was placed in your throat during general anesthesia.   You may have light cramping and bleeding. This may last for 2 days to 2 weeks after the procedure.   Your uterus needs to make a new lining after the procedure. This may make your next period late. Document Released: 08/24/2005 Document Revised: 04/26/2013 Document Reviewed: 03/23/2013 [facility] Patient Information 2015 [place], [place]. This information is not intended to replace advice given to you by your health care provider. Make sure you discuss any questions you have with your health care provider. PATIENT INSTRUCTIONS POST-ANESTHESIA  IMMEDIATELY FOLLOWING SURGERY:  Do not drive or operate machinery for the first twenty four hours after surgery.  Do not make any important decisions for twenty four hours after surgery or while taking narcotic pain medications or sedatives.  If you develop intractable nausea and vomiting or a severe headache please notify your doctor immediately.  FOLLOW-UP:  Please make an appointment with your surgeon as instructed. You do not need to follow up with anesthesia unless specifically instructed to do so.  WOUND CARE INSTRUCTIONS (if applicable):  Keep a dry clean dressing on the anesthesia/puncture wound site if there is drainage.  Once the wound has quit draining you may leave it open to  air.  Generally you should leave the bandage intact for twenty four hours unless there is drainage.  If the epidural site drains for more than 36-48 hours please call the anesthesia department.  QUESTIONS?:  Please feel free to call your physician or the hospital operator if you have  any questions, and they will be happy to assist you.

## 2014-10-22 ENCOUNTER — Encounter (HOSPITAL_COMMUNITY)
Admission: RE | Admit: 2014-10-22 | Discharge: 2014-10-22 | Disposition: A | Discharge status: Home / Self Care | Payer: BLUE CROSS/BLUE SHIELD | Source: Ambulatory Visit | Attending: Obstetrics & Gynecology | Admitting: Obstetrics & Gynecology

## 2014-10-22 ENCOUNTER — Encounter (HOSPITAL_COMMUNITY): Payer: Self-pay

## 2014-10-22 ENCOUNTER — Other Ambulatory Visit (HOSPITAL_COMMUNITY): Payer: Self-pay

## 2014-10-22 DIAGNOSIS — N840 Polyp of corpus uteri: Secondary | ICD-10-CM | POA: Diagnosis not present

## 2014-10-22 DIAGNOSIS — Z79899 Other long term (current) drug therapy: Secondary | ICD-10-CM | POA: Diagnosis not present

## 2014-10-22 DIAGNOSIS — Z6841 Body Mass Index (BMI) 40.0 and over, adult: Secondary | ICD-10-CM | POA: Diagnosis not present

## 2014-10-22 DIAGNOSIS — N946 Dysmenorrhea, unspecified: Secondary | ICD-10-CM | POA: Diagnosis present

## 2014-10-22 DIAGNOSIS — N921 Excessive and frequent menstruation with irregular cycle: Secondary | ICD-10-CM | POA: Diagnosis present

## 2014-10-22 DIAGNOSIS — R938 Abnormal findings on diagnostic imaging of other specified body structures: Secondary | ICD-10-CM | POA: Diagnosis not present

## 2014-10-22 DIAGNOSIS — Z87891 Personal history of nicotine dependence: Secondary | ICD-10-CM | POA: Diagnosis not present

## 2014-10-22 DIAGNOSIS — Z862 Personal history of diseases of the blood and blood-forming organs and certain disorders involving the immune mechanism: Secondary | ICD-10-CM | POA: Diagnosis not present

## 2014-10-22 LAB — URINE MICROSCOPIC-ADD ON

## 2014-10-22 LAB — URINALYSIS, ROUTINE W REFLEX MICROSCOPIC
Bilirubin Urine: NEGATIVE
Glucose, UA: NEGATIVE mg/dL
Ketones, ur: NEGATIVE mg/dL
Nitrite: NEGATIVE
PH: 6.5 (ref 5.0–8.0)
Specific Gravity, Urine: 1.02 (ref 1.005–1.030)
Urobilinogen, UA: 0.2 mg/dL (ref 0.0–1.0)

## 2014-10-22 LAB — COMPREHENSIVE METABOLIC PANEL
ALT: 31 U/L (ref 0–35)
AST: 28 U/L (ref 0–37)
Albumin: 3.5 g/dL (ref 3.5–5.2)
Alkaline Phosphatase: 41 U/L (ref 39–117)
Anion gap: 4 — ABNORMAL LOW (ref 5–15)
BUN: 12 mg/dL (ref 6–23)
CO2: 24 mmol/L (ref 19–32)
Calcium: 8.5 mg/dL (ref 8.4–10.5)
Chloride: 110 mmol/L (ref 96–112)
Creatinine, Ser: 0.62 mg/dL (ref 0.50–1.10)
GFR calc Af Amer: >90 mL/min (ref >90)
GFR calc non Af Amer: >90 mL/min (ref >90)
Glucose, Bld: 101 mg/dL — ABNORMAL HIGH (ref 70–99)
Potassium: 3.7 mmol/L (ref 3.5–5.1)
Sodium: 138 mmol/L (ref 135–145)
TOTAL PROTEIN: 6.3 g/dL (ref 6.0–8.3)
Total Bilirubin: 0.3 mg/dL (ref 0.3–1.2)

## 2014-10-22 LAB — CBC
HCT: 34.1 % — ABNORMAL LOW (ref 36.0–46.0)
HEMOGLOBIN: 11.1 g/dL — AB (ref 12.0–15.0)
MCH: 26.8 pg (ref 26.0–34.0)
MCHC: 32.6 g/dL (ref 30.0–36.0)
MCV: 82.4 fL (ref 78.0–100.0)
Platelets: 226 10*3/uL (ref 150–400)
RBC: 4.14 MIL/uL (ref 3.87–5.11)
RDW: 18.6 % — ABNORMAL HIGH (ref 11.5–15.5)
WBC: 3.6 10*3/uL — ABNORMAL LOW (ref 4.0–10.5)

## 2014-10-22 LAB — HCG, QUANTITATIVE, PREGNANCY

## 2014-10-22 NOTE — Pre-Procedure Instructions (Signed)
Patient given information to sign up for my chart at home. 

## 2014-10-24 ENCOUNTER — Encounter (HOSPITAL_COMMUNITY)
Admission: RE | Disposition: A | Discharge status: Home / Self Care | Payer: Self-pay | Source: Ambulatory Visit | Attending: Obstetrics & Gynecology

## 2014-10-24 ENCOUNTER — Ambulatory Visit (HOSPITAL_COMMUNITY): Payer: BLUE CROSS/BLUE SHIELD | Admitting: Anesthesiology

## 2014-10-24 ENCOUNTER — Ambulatory Visit (HOSPITAL_COMMUNITY)
Admission: RE | Admit: 2014-10-24 | Discharge: 2014-10-24 | Disposition: A | Discharge status: Home / Self Care | Payer: BLUE CROSS/BLUE SHIELD | Source: Ambulatory Visit | Attending: Obstetrics & Gynecology | Admitting: Obstetrics & Gynecology

## 2014-10-24 ENCOUNTER — Telehealth: Payer: Self-pay | Admitting: *Deleted

## 2014-10-24 ENCOUNTER — Encounter (HOSPITAL_COMMUNITY): Payer: Self-pay | Admitting: *Deleted

## 2014-10-24 DIAGNOSIS — Z862 Personal history of diseases of the blood and blood-forming organs and certain disorders involving the immune mechanism: Secondary | ICD-10-CM | POA: Insufficient documentation

## 2014-10-24 DIAGNOSIS — N946 Dysmenorrhea, unspecified: Secondary | ICD-10-CM | POA: Insufficient documentation

## 2014-10-24 DIAGNOSIS — N921 Excessive and frequent menstruation with irregular cycle: Secondary | ICD-10-CM | POA: Insufficient documentation

## 2014-10-24 DIAGNOSIS — Z79899 Other long term (current) drug therapy: Secondary | ICD-10-CM | POA: Insufficient documentation

## 2014-10-24 DIAGNOSIS — Z87891 Personal history of nicotine dependence: Secondary | ICD-10-CM | POA: Insufficient documentation

## 2014-10-24 DIAGNOSIS — N840 Polyp of corpus uteri: Secondary | ICD-10-CM | POA: Insufficient documentation

## 2014-10-24 DIAGNOSIS — R938 Abnormal findings on diagnostic imaging of other specified body structures: Secondary | ICD-10-CM | POA: Insufficient documentation

## 2014-10-24 DIAGNOSIS — N920 Excessive and frequent menstruation with regular cycle: Secondary | ICD-10-CM

## 2014-10-24 DIAGNOSIS — Z6841 Body Mass Index (BMI) 40.0 and over, adult: Secondary | ICD-10-CM | POA: Insufficient documentation

## 2014-10-24 HISTORY — PX: DILITATION & CURRETTAGE/HYSTROSCOPY WITH NOVASURE ABLATION: SHX5568

## 2014-10-24 SURGERY — DILATATION & CURETTAGE/HYSTEROSCOPY WITH NOVASURE ABLATION
Anesthesia: General

## 2014-10-24 MED ORDER — MIDAZOLAM HCL 2 MG/2ML IJ SOLN
INTRAMUSCULAR | Status: AC
  Filled 2014-10-24: qty 2

## 2014-10-24 MED ORDER — FENTANYL CITRATE 0.05 MG/ML IJ SOLN
INTRAMUSCULAR | Status: AC
  Filled 2014-10-24: qty 2

## 2014-10-24 MED ORDER — ONDANSETRON HCL 8 MG PO TABS: 8.0000 mg | ORAL_TABLET | Freq: Three times a day (TID) | ORAL | Status: DC | PRN

## 2014-10-24 MED ORDER — ONDANSETRON HCL 4 MG/2ML IJ SOLN
4.0000 mg | Freq: Once | INTRAMUSCULAR | Status: AC
  Administered 2014-10-24: 4 mg via INTRAVENOUS

## 2014-10-24 MED ORDER — PROPOFOL 10 MG/ML IV BOLUS
INTRAVENOUS | Status: DC | PRN
  Administered 2014-10-24: 150 mg via INTRAVENOUS

## 2014-10-24 MED ORDER — KETOROLAC TROMETHAMINE 10 MG PO TABS: 10.0000 mg | ORAL_TABLET | Freq: Three times a day (TID) | ORAL | Status: DC | PRN

## 2014-10-24 MED ORDER — KETOROLAC TROMETHAMINE 30 MG/ML IJ SOLN
30.0000 mg | Freq: Once | INTRAMUSCULAR | Status: AC
  Administered 2014-10-24: 30 mg via INTRAVENOUS
  Filled 2014-10-24: qty 1

## 2014-10-24 MED ORDER — ONDANSETRON HCL 4 MG/2ML IJ SOLN
INTRAMUSCULAR | Status: AC
  Filled 2014-10-24: qty 2

## 2014-10-24 MED ORDER — GLYCOPYRROLATE 0.2 MG/ML IJ SOLN
INTRAMUSCULAR | Status: AC
  Filled 2014-10-24: qty 2

## 2014-10-24 MED ORDER — MIDAZOLAM HCL 2 MG/2ML IJ SOLN
1.0000 mg | INTRAMUSCULAR | Status: DC | PRN
  Administered 2014-10-24 (×2): 2 mg via INTRAVENOUS
  Filled 2014-10-24: qty 2

## 2014-10-24 MED ORDER — MIDAZOLAM HCL 5 MG/5ML IJ SOLN
INTRAMUSCULAR | Status: DC | PRN
  Administered 2014-10-24: 2 mg via INTRAVENOUS

## 2014-10-24 MED ORDER — LIDOCAINE HCL (PF) 1 % IJ SOLN
INTRAMUSCULAR | Status: AC
  Filled 2014-10-24: qty 5

## 2014-10-24 MED ORDER — FENTANYL CITRATE 0.05 MG/ML IJ SOLN: 25.0000 ug | INTRAMUSCULAR | Status: DC | PRN

## 2014-10-24 MED ORDER — FENTANYL CITRATE 0.05 MG/ML IJ SOLN
INTRAMUSCULAR | Status: DC | PRN
  Administered 2014-10-24 (×4): 25 ug via INTRAVENOUS

## 2014-10-24 MED ORDER — ONDANSETRON HCL 4 MG/2ML IJ SOLN: 4.0000 mg | Freq: Once | INTRAMUSCULAR | Status: DC | PRN

## 2014-10-24 MED ORDER — LIDOCAINE HCL 1 % IJ SOLN
INTRAMUSCULAR | Status: DC | PRN
  Administered 2014-10-24: 30 mg via INTRADERMAL

## 2014-10-24 MED ORDER — SODIUM CHLORIDE 0.9 % IR SOLN
Status: DC | PRN
Start: 2014-10-24 — End: 2014-10-24
  Administered 2014-10-24 (×2): 1000 mL

## 2014-10-24 MED ORDER — HYDROCODONE-ACETAMINOPHEN 5-325 MG PO TABS: 1.0000 | ORAL_TABLET | Freq: Four times a day (QID) | ORAL | Status: DC | PRN

## 2014-10-24 MED ORDER — PROPOFOL 10 MG/ML IV BOLUS
INTRAVENOUS | Status: AC
  Filled 2014-10-24: qty 20

## 2014-10-24 MED ORDER — LACTATED RINGERS IV SOLN
INTRAVENOUS | Status: DC
  Administered 2014-10-24: 1000 mL via INTRAVENOUS

## 2014-10-24 MED ORDER — FENTANYL CITRATE 0.05 MG/ML IJ SOLN
25.0000 ug | INTRAMUSCULAR | Status: AC
  Administered 2014-10-24 (×2): 25 ug via INTRAVENOUS

## 2014-10-24 MED ORDER — CEFAZOLIN SODIUM-DEXTROSE 2-3 GM-% IV SOLR
2.0000 g | INTRAVENOUS | Status: AC
  Administered 2014-10-24: 2 g via INTRAVENOUS
  Filled 2014-10-24: qty 50

## 2014-10-24 SURGICAL SUPPLY — 26 items
ABLATOR ENDOMETRIAL BIPOLAR (ABLATOR) ×5 IMPLANT
BAG HAMPER (MISCELLANEOUS) ×3 IMPLANT
CLOTH BEACON ORANGE TIMEOUT ST (SAFETY) ×3 IMPLANT
COVER LIGHT HANDLE STERIS (MISCELLANEOUS) ×6 IMPLANT
FORMALIN 10 PREFIL 120ML (MISCELLANEOUS) ×1 IMPLANT
GLOVE BIOGEL PI IND STRL 7.0 (GLOVE) IMPLANT
GLOVE BIOGEL PI IND STRL 7.5 (GLOVE) IMPLANT
GLOVE BIOGEL PI IND STRL 8 (GLOVE) ×1 IMPLANT
GLOVE BIOGEL PI INDICATOR 7.0 (GLOVE) ×2
GLOVE BIOGEL PI INDICATOR 7.5 (GLOVE) ×2
GLOVE BIOGEL PI INDICATOR 8 (GLOVE) ×2
GLOVE ECLIPSE 6.5 STRL STRAW (GLOVE) ×2 IMPLANT
GLOVE ECLIPSE 8.0 STRL XLNG CF (GLOVE) ×3 IMPLANT
GOWN STRL REUS W/TWL LRG LVL3 (GOWN DISPOSABLE) ×3 IMPLANT
GOWN STRL REUS W/TWL XL LVL3 (GOWN DISPOSABLE) ×3 IMPLANT
INST SET HYSTEROSCOPY (KITS) ×3 IMPLANT
IV NS 1000ML (IV SOLUTION) ×6
IV NS 1000ML BAXH (IV SOLUTION) ×1 IMPLANT
KIT ROOM TURNOVER AP CYSTO (KITS) ×3 IMPLANT
MANIFOLD NEPTUNE II (INSTRUMENTS) ×3 IMPLANT
NS IRRIG 1000ML POUR BTL (IV SOLUTION) ×1 IMPLANT
PACK PERI GYN (CUSTOM PROCEDURE TRAY) ×3 IMPLANT
PAD ARMBOARD 7.5X6 YLW CONV (MISCELLANEOUS) ×3 IMPLANT
PAD TELFA 3X4 1S STER (GAUZE/BANDAGES/DRESSINGS) ×3 IMPLANT
SET BASIN LINEN APH (SET/KITS/TRAYS/PACK) ×3 IMPLANT
SET IRRIG Y TYPE TUR BLADDER L (SET/KITS/TRAYS/PACK) ×3 IMPLANT

## 2014-10-24 NOTE — Op Note (Signed)
Preoperative diagnosis: Menometrorrhagia                                        Dysmenorrhea                                          Postoperative diagnoses: Same as above   Procedure: Hysteroscopy,  endometrial ablation using Novasure  Surgeon: Florian Buff   Anesthesia: Laryngeal mask airway  Findings: The endometrium was significant for small endometrial polyp. There were no fibroid or other abnormalities.  Description of operation: The patient was taken to the operating room and placed in the supine position. She underwent general anesthesia using the laryngeal mask airway. She was placed in the dorsal lithotomy position and prepped and draped in the usual sterile fashion. A Graves speculum was placed and the anterior cervical lip was grasped with a single-tooth tenaculum. The cervix was dilated serially to allow passage of the hysteroscope. Diagnostic hysteroscopy was performed and was found to be normal.   I then proceeded to perform the Novasure endometrial ablation.  The cervical length was 4. The uterus sounded to  9.5 cm yielding a net length of 5.5 cm.  The endometrial cavity was 4.5 cm wide. The power was 136 watts.  The total time of therapy was 0 min 55 seconds. The array was evaluated after the procedure and tissue was adherent on all the dimensions of the surface, confirming fundal treatment as well.    All of the equipment worked well throughout the procedure.  The patient was awakened from anesthesia and taken to the recovery room in good stable condition all counts were correct. She received 2 g of Ancef and 30 mg of Toradol preoperatively. She will be discharged from the recovery room and followed up in the office in 1- 2 weeks.  EURE,LUTHER H 10/24/2014 11:15 AM

## 2014-10-24 NOTE — Transfer of Care (Signed)
Immediate Anesthesia Transfer of Care Note  Patient: Jodi Chaney  Procedure(s) Performed: Procedure(s) with comments: HYSTEROSCOPY WITH NOVASURE ENDOMETRIAL ABLATION (N/A) - Uterine Cavity Length 5.5cm Uterine Cavity Width 4.5cm power = 136 watts time = 55 seconds  Patient Location: PACU  Anesthesia Type:General  Level of Consciousness: awake, alert  and patient cooperative  Airway & Oxygen Therapy: Patient Spontanous Breathing and Patient connected to face mask oxygen  Post-op Assessment: Report given to RN, Post -op Vital signs reviewed and stable and Patient moving all extremities  Post vital signs: Reviewed and stable  Last Vitals:  Filed Vitals:   10/24/14 1015  BP: 104/44  Pulse:   Temp:   Resp: 20    Complications: No apparent anesthesia complications

## 2014-10-24 NOTE — Discharge Instructions (Signed)
Endometrial Ablation Endometrial ablation removes the lining of the uterus (endometrium). It is usually a same-day, outpatient treatment. Ablation helps avoid major surgery, such as surgery to remove the cervix and uterus (hysterectomy). After endometrial ablation, you will have little or no menstrual bleeding and may not be able to have children. However, if you are premenopausal, you will need to use a reliable method of birth control following the procedure because of the small chance that pregnancy can occur. There are different reasons to have this procedure, which include:  Heavy periods.  Bleeding that is causing anemia.  Irregular bleeding.  Bleeding fibroids on the lining inside the uterus if they are smaller than 3 centimeters. This procedure should not be done if:  You want children in the future.  You have severe cramps with your menstrual period.  You have precancerous or cancerous cells in your uterus.  You were recently pregnant.  You have gone through menopause.  You have had major surgery on the uterus, such as a cesarean delivery. LET YOUR HEALTH CARE PROVIDER KNOW ABOUT:  Any allergies you have.  All medicines you are taking, including vitamins, herbs, eye drops, creams, and over-the-counter medicines.  Previous problems you or members of your family have had with the use of anesthetics.  Any blood disorders you have.  Previous surgeries you have had.  Medical conditions you have. RISKS AND COMPLICATIONS  Generally, this is a safe procedure. However, as with any procedure, complications can occur. Possible complications include:  Perforation of the uterus.  Bleeding.  Infection of the uterus, bladder, or vagina.  Injury to surrounding organs.  An air bubble to the lung (air embolus).  Pregnancy following the procedure.  Failure of the procedure to help the problem, requiring hysterectomy.  Decreased ability to diagnose cancer in the lining of  the uterus. BEFORE THE PROCEDURE  The lining of the uterus must be tested to make sure there is no pre-cancerous or cancer cells present.  An ultrasound may be performed to look at the size of the uterus and to check for abnormalities.  Medicines may be given to thin the lining of the uterus. PROCEDURE  During the procedure, your health care provider will use a tool called a resectoscope to help see inside your uterus. There are different ways to remove the lining of your uterus.   Radiofrequency - This method uses a radiofrequency-alternating electric current to remove the lining of the uterus.  Cryotherapy - This method uses extreme cold to freeze the lining of the uterus.  Heated-Free Liquid - This method uses heated salt (saline) solution to remove the lining of the uterus.  Microwave - This method uses high-energy microwaves to heat up the lining of the uterus to remove it.  Thermal balloon - This method involves inserting a catheter with a balloon tip into the uterus. The balloon tip is filled with heated fluid to remove the lining of the uterus. AFTER THE PROCEDURE  After your procedure, do not have sexual intercourse or insert anything into your vagina until permitted by your health care provider. After the procedure, you may experience:  Cramps.  Vaginal discharge.  Frequent urination. Document Released: 07/03/2004 Document Revised: 04/26/2013 Document Reviewed: 01/25/2013 Exit[employer] Patient Information 2015 [employer]. This information is not intended to replace advice given to you by your health care provider. Make sure you discuss any questions you have with your health care provider.  

## 2014-10-24 NOTE — H&P (Signed)
Preoperative History and Physical  Jodi Chaney is a 44 y.o. [phone] with No LMP recorded. admitted for a hysteroscopy uterine curettage NovaSure endometrial ablation removal of endometrial polyp.  Pt has endometrialpolyp vs myoma and long standing heavy crampy periods  PMH:  Past Medical History  Diagnosis Date  . Anemia   . Menorrhagia 08/24/2014  . Thickened endometrium 10/04/2014    ?polyp to see Dr Elonda Husky about [facility] and ablation  . Fibroids 10/04/2014  . Endometrial polyp 10/04/2014    PSH:  Past Surgical History  Procedure Laterality Date  . Cesarean section    . Knee surgery    . Tubal ligation      POb/GynH:  OB History    Gravida Para Term Preterm AB TAB SAB Ectopic Multiple Living   4 3   1 1    3       SH:  History  Substance Use Topics  . Smoking status: Former Smoker    Types: Cigarettes    Quit date: 10/21/2010  . Smokeless tobacco: Never Used  . Alcohol Use: Yes     Comment: occ    FH:  Family History  Problem Relation Age of Onset  . Diabetes Mother   . Hypertension Father   . Other Father     has had open heart surgery; has one kidney  . Hypertension Sister   . Diabetes Brother   . Anxiety disorder Daughter   . Bipolar disorder Son   . Schizophrenia Son   . Diabetes Maternal Grandmother   . Kidney disease Maternal Grandfather   . Cancer Paternal Grandmother     breast  . Cancer Sister     breast  . Diabetes Brother     borderline  . Seizures Son      Allergies: No Known Allergies  Medications:  Current outpatient prescriptions:  . megestrol (MEGACE) 40 MG tablet, Take 1 tablet (40 mg total) by mouth daily., Disp: 30 tablet, Rfl: 3  Review of Systems:   Review of Systems  Constitutional: Negative for fever, chills, weight loss, malaise/fatigue and  diaphoresis.  HENT: Negative for hearing loss, ear pain, nosebleeds, congestion, sore throat, neck pain, tinnitus and ear discharge.  Eyes: Negative for blurred vision, double vision, photophobia, pain, discharge and redness.  Respiratory: Negative for cough, hemoptysis, sputum production, shortness of breath, wheezing and stridor.  Cardiovascular: Negative for chest pain, palpitations, orthopnea, claudication, leg swelling and PND.  Gastrointestinal: Positive for abdominal pain. Negative for heartburn, nausea, vomiting, diarrhea, constipation, blood in stool and melena.  Genitourinary: Negative for dysuria, urgency, frequency, hematuria and flank pain.  Musculoskeletal: Negative for myalgias, back pain, joint pain and falls.  Skin: Negative for itching and rash.  Neurological: Negative for dizziness, tingling, tremors, sensory change, speech change, focal weakness, seizures, loss of consciousness, weakness and headaches.  Endo/Heme/Allergies: Negative for environmental allergies and polydipsia. Does not bruise/bleed easily.  Psychiatric/Behavioral: Negative for depression, suicidal ideas, hallucinations, memory loss and substance abuse. The patient is not nervous/anxious and does not have insomnia.     PHYSICAL EXAM:  Blood pressure 100/70, height 5\' 1"  (1.549 m), weight 230 lb (104.327 kg).   Vitals reviewed. Constitutional: She is oriented to person, place, and time. She appears well-developed and well-nourished.  HENT:  Head: Normocephalic and atraumatic.  Right Ear: External ear normal.  Left Ear: External ear normal.  Nose: Nose normal.  Mouth/Throat: Oropharynx is clear and moist.  Eyes: Conjunctivae and EOM are normal. Pupils are equal, round,  and reactive to light. Right eye exhibits no discharge. Left eye exhibits no discharge. No scleral icterus.  Neck: Normal range of motion. Neck supple. No tracheal deviation present. No thyromegaly present.  Cardiovascular: Normal  rate, regular rhythm, normal heart sounds and intact distal pulses. Exam reveals no gallop and no friction rub.  No murmur heard. Respiratory: Effort normal and breath sounds normal. No respiratory distress. She has no wheezes. She has no rales. She exhibits no tenderness.  GI: Soft. Bowel sounds are normal. She exhibits no distension and no mass. There is tenderness. There is no rebound and no guarding.  Genitourinary:   Vulva is normal without lesions Vagina is pink moist without discharge Cervix normal in appearance and pap is normal Uterus is significant for small myomas, endometrial mass Adnexa is negative with normal sized ovaries by sonogram  Musculoskeletal: Normal range of motion. She exhibits no edema and no tenderness.  Neurological: She is alert and oriented to person, place, and time. She has normal reflexes. She displays normal reflexes. No cranial nerve deficit. She exhibits normal muscle tone. Coordination normal.  Skin: Skin is warm and dry. No rash noted. No erythema. No pallor.  Psychiatric: She has a normal mood and affect. Her behavior is normal. Judgment and thought content normal.    Labs: No results found for this or any previous visit (from the past 336 hour(s)).  EKG: No orders found for this or any previous visit.  Imaging Studies:  Imaging Results    US Transvaginal Non-ob  10/10/2014 GYNECOLOGIC SONOGRAM CHERYSH Chaney is a 44 y.o. [phone] for a pelvic sonogram for menorrhagia with iron deficiency. Pt is currently taking Megace. Uterus 10.1 x 7.5 x 6.8 cm, retroverted with multiple fibroids noted largest=3.2cm Endometrium * 22.22 mm, asymmetrical, distorted by fibroids, thickened and hyperechoic area noted within ?polyp (+Doppler flow noted)=1.7 x1.3cm Right ovary 2.7 x 1.9 x 1.6 cm, Left ovary 2.9 x 2.3 x 1.6 cm, No free fluid or adnexal masses noted within the pelvis  Technician Comments: Retroverted uterus with multiple fibroids noted, thickened endometrium with a hyperechoic area noted within ?polyp, bilateral adnexa/ovaries appears WNL, no free fluid noted within the pelvis Jodi Chaney 10/04/2014 9:46 AM Clinical Impression and recommendations: I have reviewed the sonogram results above, combined with the patient's current clinical course, below are my impressions and any appropriate recommendations for management based on the sonographic findings. Thickened endometrium secondary to megestrol therapy Otherwise relatively normal uterus, small myomas Normal ovaries EURE,LUTHER H 10/10/2014 6:22 PM       Assessment: Patient Active Problem List   Diagnosis Date Noted  . Thickened endometrium 10/04/2014  . Fibroids 10/04/2014  . Endometrial polyp 10/04/2014  . Anemia 08/24/2014  . Menorrhagia 08/24/2014    Plan: Hysteroscopy uterine curettage endometrial ablation NovaSure with removal of submucosal mass

## 2014-10-24 NOTE — Anesthesia Preprocedure Evaluation (Signed)
Anesthesia Evaluation  Patient identified by MRN, date of birth, ID band Patient awake    Airway Mallampati: II  TM Distance: >3 FB     Dental  (+) Teeth Intact   Pulmonary neg pulmonary ROS, former smoker,  breath sounds clear to auscultation        Cardiovascular negative cardio ROS  Rhythm:Regular Rate:Normal     Neuro/Psych    GI/Hepatic negative GI ROS,   Endo/Other  Morbid obesity  Renal/GU      Musculoskeletal   Abdominal   Peds  Hematology  (+) anemia ,   Anesthesia Other Findings   Reproductive/Obstetrics                             Anesthesia Physical Anesthesia Plan  ASA: II  Anesthesia Plan: General   Post-op Pain Management:    Induction: Intravenous  Airway Management Planned: LMA  Additional Equipment:   Intra-op Plan:   Post-operative Plan: Extubation in OR  Informed Consent: I have reviewed the patients History and Physical, chart, labs and discussed the procedure including the risks, benefits and alternatives for the proposed anesthesia with the patient or authorized representative who has indicated his/her understanding and acceptance.     Plan Discussed with:   Anesthesia Plan Comments:         Anesthesia Quick Evaluation

## 2014-10-24 NOTE — Anesthesia Postprocedure Evaluation (Signed)
  Anesthesia Post-op Note  Patient: Jodi Chaney  Procedure(s) Performed: Procedure(s) with comments: HYSTEROSCOPY WITH NOVASURE ENDOMETRIAL ABLATION (N/A) - Uterine Cavity Length 5.5cm Uterine Cavity Width 4.5cm power = 136 watts time = 55 seconds  Patient Location: PACU  Anesthesia Type:General  Level of Consciousness: awake, alert , oriented and patient cooperative  Airway and Oxygen Therapy: Patient Spontanous Breathing  Post-op Pain: 2 /10, mild  Post-op Assessment: Post-op Vital signs reviewed, Patient's Cardiovascular Status Stable, Respiratory Function Stable, Patent Airway and No signs of Nausea or vomiting  Post-op Vital Signs: Reviewed and stable  Last Vitals:  Filed Vitals:   10/24/14 1015  BP: 104/44  Pulse:   Temp:   Resp: 20    Complications: No apparent anesthesia complications

## 2014-10-24 NOTE — Telephone Encounter (Signed)
Pt requesting a note to return to work for Monday, 10/29/2014.

## 2014-10-24 NOTE — Anesthesia Procedure Notes (Signed)
Procedure Name: LMA Insertion Date/Time: 10/24/2014 10:32 AM Performed by: Charmaine Downs Pre-anesthesia Checklist: Patient identified, Emergency Drugs available, Suction available and Patient being monitored Patient Re-evaluated:Patient Re-evaluated prior to inductionOxygen Delivery Method: Circle system utilized Preoxygenation: Pre-oxygenation with 100% oxygen Intubation Type: IV induction Ventilation: Mask ventilation without difficulty LMA: LMA inserted LMA Size: 4.0 Grade View: Grade II Tube type: Oral Number of attempts: 1 Placement Confirmation: breath sounds checked- equal and bilateral and positive ETCO2 Tube secured with: Tape Dental Injury: Teeth and Oropharynx as per pre-operative assessment

## 2014-10-25 ENCOUNTER — Encounter (HOSPITAL_COMMUNITY): Payer: Self-pay | Admitting: Obstetrics & Gynecology

## 2014-10-25 NOTE — Telephone Encounter (Signed)
Note for pt to return to work on 10/29/2014 left at front desk for pt to pick up.

## 2014-10-25 NOTE — Telephone Encounter (Signed)
Yes that is fine

## 2014-10-31 ENCOUNTER — Ambulatory Visit (INDEPENDENT_AMBULATORY_CARE_PROVIDER_SITE_OTHER): Payer: BLUE CROSS/BLUE SHIELD | Admitting: Obstetrics & Gynecology

## 2014-10-31 ENCOUNTER — Encounter: Payer: Self-pay | Admitting: Obstetrics & Gynecology

## 2014-10-31 VITALS — BP 98/70 | HR 76 | Wt 232.0 lb

## 2014-10-31 DIAGNOSIS — Z9889 Other specified postprocedural states: Secondary | ICD-10-CM

## 2014-10-31 NOTE — Progress Notes (Signed)
Patient ID: Jodi Chaney, female   DOB: April 17, 1971, 44 y.o.   MRN: [id number]  HPI: Patient returns for routine postoperative follow-up having undergone hysteroscopy uterine curettage endometrial ablation using NovaSure on 10/24/2014. The patient's early postoperative recovery while in the hospital was notable for unremarkable out patient. Since hospital discharge the patient reports no problems.   Current Outpatient Prescriptions  Medication Sig Dispense Refill  . HYDROcodone-acetaminophen (NORCO/VICODIN) 5-325 MG per tablet Take 1 tablet by mouth every 6 (six) hours as needed. (Patient not taking: Reported on 10/31/2014) 15 tablet 0  . ketorolac (TORADOL) 10 MG tablet Take 1 tablet (10 mg total) by mouth every 8 (eight) hours as needed. (Patient not taking: Reported on 10/31/2014) 15 tablet 0  . ondansetron (ZOFRAN) 8 MG tablet Take 1 tablet (8 mg total) by mouth every 8 (eight) hours as needed for nausea. (Patient not taking: Reported on 10/31/2014) 12 tablet 0   No current facility-administered medications for this visit.    Physical Exam: NEFG Vagina pink moist watery discharge Cervix normal  Diagnostic Tests: none  Impression: S/P NovaSure ablation  Plan: Follow up 1 year  No sex for 2 more weeks

## 2014-11-09 ENCOUNTER — Telehealth: Payer: Self-pay | Admitting: Adult Health

## 2014-11-09 NOTE — Telephone Encounter (Signed)
Spoke with pt. Pt had the ablation 10/24/14 and is having a bloody discharge. I advised its normal to have a bloody discharge after that procedure. Advised pt if she is still noticing it the middle of next week, call us back. Pt voiced understanding. [place]

## 2014-12-05 ENCOUNTER — Emergency Department (HOSPITAL_COMMUNITY)
Admission: EM | Admit: 2014-12-05 | Discharge: 2014-12-05 | Disposition: A | Discharge status: Home / Self Care | Payer: BLUE CROSS/BLUE SHIELD | Attending: Emergency Medicine | Admitting: Emergency Medicine

## 2014-12-05 ENCOUNTER — Encounter (HOSPITAL_COMMUNITY): Payer: Self-pay | Admitting: Emergency Medicine

## 2014-12-05 DIAGNOSIS — Z862 Personal history of diseases of the blood and blood-forming organs and certain disorders involving the immune mechanism: Secondary | ICD-10-CM | POA: Diagnosis not present

## 2014-12-05 DIAGNOSIS — Z8742 Personal history of other diseases of the female genital tract: Secondary | ICD-10-CM | POA: Insufficient documentation

## 2014-12-05 DIAGNOSIS — J040 Acute laryngitis: Secondary | ICD-10-CM | POA: Insufficient documentation

## 2014-12-05 DIAGNOSIS — B9789 Other viral agents as the cause of diseases classified elsewhere: Secondary | ICD-10-CM

## 2014-12-05 DIAGNOSIS — R490 Dysphonia: Secondary | ICD-10-CM | POA: Diagnosis present

## 2014-12-05 DIAGNOSIS — Z86018 Personal history of other benign neoplasm: Secondary | ICD-10-CM | POA: Insufficient documentation

## 2014-12-05 DIAGNOSIS — Z87891 Personal history of nicotine dependence: Secondary | ICD-10-CM | POA: Insufficient documentation

## 2014-12-05 DIAGNOSIS — J069 Acute upper respiratory infection, unspecified: Secondary | ICD-10-CM | POA: Diagnosis not present

## 2014-12-05 MED ORDER — HYDROCOD POLST-CHLORPHEN POLST 10-8 MG/5ML PO LQCR: 5.0000 mL | Freq: Two times a day (BID) | ORAL | Status: DC | PRN

## 2014-12-05 MED ORDER — IBUPROFEN 600 MG PO TABS: 600.0000 mg | ORAL_TABLET | Freq: Four times a day (QID) | ORAL | Status: DC | PRN

## 2014-12-05 MED ORDER — BENZONATATE 100 MG PO CAPS
200.0000 mg | ORAL_CAPSULE | Freq: Once | ORAL | Status: AC
  Administered 2014-12-05: 200 mg via ORAL
  Filled 2014-12-05: qty 2

## 2014-12-05 MED ORDER — BENZONATATE 100 MG PO CAPS: 200.0000 mg | ORAL_CAPSULE | Freq: Three times a day (TID) | ORAL | Status: DC | PRN

## 2014-12-05 NOTE — ED Provider Notes (Signed)
CSN: [id number]     Arrival date & time 12/05/14  [place] History   First MD Initiated Contact with Patient 12/05/14 1908     Chief Complaint  Patient presents with  . Hoarse     (Consider location/radiation/quality/duration/timing/severity/associated sxs/prior Treatment) The history is provided by the patient.   Jodi Chaney is a 44 y.o. female presenting with a 3 day history of chills without fever, sore throat, nasal congestion, cough sometimes productive of of yellow sputum production and laryngitis since yesterday. She denies chest pain, sob, wheezing, facial pain, headaches, earache.  She has had yellow nasal discharge.  She has found no alleviators.     Past Medical History  Diagnosis Date  . Anemia   . Menorrhagia 08/24/2014  . Thickened endometrium 10/04/2014    ?polyp to see Dr Elonda Husky about [facility] and ablation  . Fibroids 10/04/2014  . Endometrial polyp 10/04/2014   Past Surgical History  Procedure Laterality Date  . Cesarean section      x3  . Knee surgery Left     removal of bone-from being bow-legged- done X2.  . Tubal ligation    . Wisdom tooth extraction    . Dilitation & currettage/hystroscopy with novasure ablation N/A 10/24/2014    Procedure: HYSTEROSCOPY WITH NOVASURE ENDOMETRIAL ABLATION;  Surgeon: Florian Buff, MD;  Location: [facility];  Service: Gynecology;  Laterality: N/A;  Uterine Cavity Length 5.5cm Uterine Cavity Width 4.5cm power = 136 watts time = 55 seconds   Family History  Problem Relation Age of Onset  . Diabetes Mother   . Hypertension Father   . Other Father     has had open heart surgery; has one kidney  . Hypertension Sister   . Diabetes Brother   . Anxiety disorder Daughter   . Bipolar disorder Son   . Schizophrenia Son   . Diabetes Maternal Grandmother   . Kidney disease Maternal Grandfather   . Cancer Paternal Grandmother     breast  . Cancer Sister     breast  . Diabetes Brother     borderline  . Seizures Son    History   Substance Use Topics  . Smoking status: Former Smoker -- 0.25 packs/day for 10 years    Types: Cigarettes    Quit date: 10/22/2007  . Smokeless tobacco: Never Used  . Alcohol Use: Yes     Comment: occ   OB History    Gravida Para Term Preterm AB TAB SAB Ectopic Multiple Living   4 3   1 1    3      Review of Systems  Constitutional: Positive for chills. Negative for fever.  HENT: Positive for congestion, rhinorrhea, sore throat and voice change. Negative for ear pain, sinus pressure and trouble swallowing.   Eyes: Negative for discharge.  Respiratory: Positive for cough. Negative for shortness of breath, wheezing and stridor.   Cardiovascular: Negative for chest pain.  Gastrointestinal: Negative for nausea and abdominal pain.  Genitourinary: Negative.       Allergies  Review of patient's allergies indicates no known allergies.  Home Medications   Prior to Admission medications   Medication Sig Start Date End Date Taking? Authorizing Provider  benzonatate (TESSALON) 100 MG capsule Take 2 capsules (200 mg total) by mouth 3 (three) times daily as needed for cough. 12/05/14   Evalee Jefferson, PA-C  chlorpheniramine-HYDROcodone (TUSSIONEX PENNKINETIC ER) 10-8 MG/5ML LQCR Take 5 mLs by mouth every 12 (twelve) hours as needed for cough. 12/05/14  Evalee Jefferson, PA-C  HYDROcodone-acetaminophen (NORCO/VICODIN) 5-325 MG per tablet Take 1 tablet by mouth every 6 (six) hours as needed. Patient not taking: Reported on 10/31/2014 10/24/14   Florian Buff, MD  ibuprofen (ADVIL,MOTRIN) 600 MG tablet Take 1 tablet (600 mg total) by mouth every 6 (six) hours as needed. 12/05/14   Evalee Jefferson, PA-C  ketorolac (TORADOL) 10 MG tablet Take 1 tablet (10 mg total) by mouth every 8 (eight) hours as needed. Patient not taking: Reported on 10/31/2014 10/24/14   Florian Buff, MD  ondansetron (ZOFRAN) 8 MG tablet Take 1 tablet (8 mg total) by mouth every 8 (eight) hours as needed for nausea. Patient not taking:  Reported on 10/31/2014 10/24/14   Florian Buff, MD   BP 109/44 mmHg  Pulse 90  Temp(Src) 98.1 F (36.7 C) (Oral)  Resp 20  Ht 5\' 1"  (1.549 m)  Wt 232 lb (105.235 kg)  BMI 43.86 kg/m2  SpO2 100%  LMP 07/10/2014 Physical Exam  Constitutional: She is oriented to person, place, and time. She appears well-developed and well-nourished.  HENT:  Head: Normocephalic and atraumatic.  Right Ear: Tympanic membrane and ear canal normal.  Left Ear: Tympanic membrane and ear canal normal.  Nose: Mucosal edema present. No rhinorrhea.  Mouth/Throat: Uvula is midline, oropharynx is clear and moist and mucous membranes are normal. No oropharyngeal exudate, posterior oropharyngeal edema, posterior oropharyngeal erythema or tonsillar abscesses.  Hoarseness of voice with transient episodes of laryngitis.  Eyes: Conjunctivae are normal.  Cardiovascular: Normal rate and normal heart sounds.   Pulmonary/Chest: Effort normal. No respiratory distress. She has no decreased breath sounds. She has no wheezes. She has no rhonchi. She has no rales.  Abdominal: Soft. There is no tenderness.  Musculoskeletal: Normal range of motion.  Neurological: She is alert and oriented to person, place, and time.  Skin: Skin is warm and dry. No rash noted.  Psychiatric: She has a normal mood and affect.    ED Course  Procedures (including critical care time) Labs Review Labs Reviewed - No data to display  Imaging Review No results found.   EKG Interpretation None      MDM   Final diagnoses:  Viral URI with cough  Laryngitis    Patient was encouraged rest and increase fluid intake.  Throat lozenges, voice rest, discussed whispering when she needs to talk.  She is prescribed ibuprofen 600 mg for inflammation of vocal cords.  Tessalon Perles for daytime use, Tussionex for daily at bedtime use.  When necessary follow-up if symptoms are not improved with this treatment.  The patient appears reasonably screened  and/or stabilized for discharge and I doubt any other medical condition or other [facility] requiring further screening, evaluation, or treatment in the ED at this time prior to discharge.     Evalee Jefferson, PA-C 12/05/14 [place], DO 12/05/14 2014

## 2014-12-05 NOTE — ED Notes (Signed)
Onset Saturday, chills, sore throat, hoarse, cough with yellow sputum

## 2014-12-05 NOTE — Discharge Instructions (Signed)
Laryngitis At the top of your windpipe is your voice box. It is the source of your voice. Inside your voice box are 2 bands of muscles called vocal cords. When you breathe, your vocal cords are relaxed and open so that air can get into the lungs. When you decide to say something, these cords come together and vibrate. The sound from these vibrations goes into your throat and comes out through your mouth as sound. Laryngitis is an inflammation of the vocal cords that causes hoarseness, cough, loss of voice, sore throat, and dry throat. Laryngitis can be temporary (acute) or long-term (chronic). Most cases of acute laryngitis improve with time.Chronic laryngitis lasts for more than 3 weeks. CAUSES Laryngitis can often be related to excessive smoking, talking, or yelling, as well as inhalation of toxic fumes and allergies. Acute laryngitis is usually caused by a viral infection, excessive coughing, vocal strain, measles or mumps, or bacterial infections. Chronic laryngitis is usually caused by vocal cord strain, vocal cord injury, postnasal drip, growths on the vocal cords, or acid reflux. SYMPTOMS   Cough.  Sore throat.  Dry throat. RISK FACTORS  Respiratory infections.  Exposure to irritating substances, such as cigarette smoke, excessive amounts of alcohol, stomach acids, and workplace chemicals.  Voice trauma, such as vocal cord injury from shouting or speaking too loud. DIAGNOSIS  Your cargiver will perform a physical exam. During the physical exam, your caregiver will examine your throat. The most common sign of laryngitis is hoarseness. Laryngoscopy may be necessary to confirm the diagnosis of this condition. This procedure allows your caregiver to look into the larynx. HOME CARE INSTRUCTIONS  Drink enough fluids to keep your urine clear or pale yellow.  Rest until you no longer have symptoms or as directed by your caregiver.  Breathe in moist air.  Take all medicine as directed by  your caregiver.  Do not smoke.  Talk as little as possible (this includes whispering).  Write on paper instead of talking until your voice is back to normal.  Follow up with your caregiver if your condition has not improved after 10 days. SEEK MEDICAL CARE IF:   You have trouble breathing.  You cough up blood.  You have persistent fever.  You have increasing pain.  You have difficulty swallowing. MAKE SURE YOU:  Understand these instructions.  Will watch your condition.  Will get help right away if you are not doing well or get worse. Document Released: 08/24/2005 Document Revised: 11/16/2011 Document Reviewed: 10/30/2010 [facility] Patient Information 2015 [place], [place]. This information is not intended to replace advice given to you by your health care provider. Make sure you discuss any questions you have with your health care provider.  Rest and make sure you're drinking plenty of fluids.  May use the cough syrup for cough relief, but use caution as this will make you drowsy.  Do not drive within 4 hours of taking this medication.  You may use the [employer] during the day.  Rest your voice as discussed,  Whisper instead.

## 2015-01-17 ENCOUNTER — Telehealth: Payer: Self-pay | Admitting: Obstetrics & Gynecology

## 2015-01-17 NOTE — Telephone Encounter (Signed)
Pt states has Endometrial Ablation 10/31/2014 is now c/o vaginal bleeding with severe cramping, no longer taking the Megace. Please advise.

## 2015-01-18 ENCOUNTER — Telehealth: Payer: Self-pay | Admitting: Adult Health

## 2015-01-18 MED ORDER — MEGESTROL ACETATE 40 MG PO TABS: ORAL_TABLET | ORAL | Status: DC

## 2015-01-18 NOTE — Telephone Encounter (Signed)
Pt had ablation and February and period is back and changing pad every 1.5 hours will Rx megace, cal me Monday for up date, discussed with Dr Glo Herring

## 2015-01-21 NOTE — Telephone Encounter (Signed)
Pt informed per Dr. Elonda Husky to take two megace 40 mg daily to help stop vaginal bleeding. Pt verbalized understanding.

## 2015-01-21 NOTE — Telephone Encounter (Signed)
It may tke a few months for everything to settle down  In the short term, I recommend restart megace to settle the endometrium  Take 2 per day, e prescribed

## 2015-03-05 ENCOUNTER — Telehealth: Payer: Self-pay | Admitting: Adult Health

## 2015-03-05 NOTE — Telephone Encounter (Signed)
Bleeding again, do megace 3 x 5 days then 2 x 5 days then 1 daily and call me next week

## 2015-03-29 ENCOUNTER — Telehealth: Payer: Self-pay | Admitting: Adult Health

## 2015-03-29 NOTE — Telephone Encounter (Signed)
Still bleeding some with megace wants surgery, to make appt with Dr Elonda Husky

## 2015-04-01 ENCOUNTER — Ambulatory Visit (INDEPENDENT_AMBULATORY_CARE_PROVIDER_SITE_OTHER): Payer: BLUE CROSS/BLUE SHIELD | Admitting: Obstetrics & Gynecology

## 2015-04-01 ENCOUNTER — Encounter: Payer: Self-pay | Admitting: Obstetrics & Gynecology

## 2015-04-01 VITALS — BP 110/60 | HR 80 | Ht 61.0 in | Wt 239.0 lb

## 2015-04-01 DIAGNOSIS — N939 Abnormal uterine and vaginal bleeding, unspecified: Secondary | ICD-10-CM | POA: Diagnosis not present

## 2015-04-01 LAB — POCT HEMOGLOBIN: Hemoglobin: 9.2 g/dL — AB (ref 12.2–16.2)

## 2015-04-01 MED ORDER — MEGESTROL ACETATE 40 MG PO TABS: ORAL_TABLET | ORAL | Status: DC

## 2015-04-01 NOTE — Addendum Note (Signed)
Addended by: Diona Fanti A on: 04/01/2015 02:56 PM   Modules accepted: Orders

## 2015-04-01 NOTE — Progress Notes (Signed)
Patient ID: Jodi Chaney, female   DOB: Oct 29, 1970, 44 y.o.   MRN: [id number] Chief Complaint  Patient presents with  . gyn visit    need refill on megestrol.    Blood pressure 110/60, pulse 80, height 5\' 1"  (1.549 m), weight 239 lb (108.41 kg).  Subjective Patient was placed back in megestrol which has improved her bleeding significantly She is still having some spotting enough to wear a pad pink in color Essentially daily Infrequent cramping response to Motrin  Objective Hemoglobin 10.2  Labs or studies No new  Impression  Status post endometrial ablation with improved but ongoing dysfunctional bleeding  Plan/Recommendations  Continue Megace 40 mg one to 2 tablets daily If she finds her bleeding increases she will increase it to a day otherwise be on one a day  Follow up  I'll see her back in 3 months to keep a close eye on her hemoglobin and her symptoms     Face to face time:  15 minutes  Greater than 50% of the visit time was spent in counseling and coordination of care with the patient.  The summary and outline of the counseling and care coordination is summarized in the note above.   All questions were answered.

## 2015-07-02 ENCOUNTER — Ambulatory Visit: Payer: BLUE CROSS/BLUE SHIELD | Admitting: Obstetrics & Gynecology

## 2015-07-02 ENCOUNTER — Other Ambulatory Visit: Payer: Self-pay | Admitting: Adult Health

## 2015-07-02 DIAGNOSIS — Z1231 Encounter for screening mammogram for malignant neoplasm of breast: Secondary | ICD-10-CM

## 2015-07-30 ENCOUNTER — Other Ambulatory Visit: Payer: BLUE CROSS/BLUE SHIELD | Admitting: Adult Health

## 2015-08-05 ENCOUNTER — Ambulatory Visit (HOSPITAL_COMMUNITY): Payer: BLUE CROSS/BLUE SHIELD

## 2015-08-12 ENCOUNTER — Ambulatory Visit (HOSPITAL_COMMUNITY): Payer: BLUE CROSS/BLUE SHIELD

## 2015-08-15 ENCOUNTER — Other Ambulatory Visit: Payer: Self-pay | Admitting: Obstetrics & Gynecology

## 2015-08-16 ENCOUNTER — Other Ambulatory Visit: Payer: BLUE CROSS/BLUE SHIELD | Admitting: Adult Health

## 2015-08-19 ENCOUNTER — Ambulatory Visit (HOSPITAL_COMMUNITY): Payer: BLUE CROSS/BLUE SHIELD

## 2015-09-17 ENCOUNTER — Other Ambulatory Visit: Payer: BLUE CROSS/BLUE SHIELD | Admitting: Adult Health

## 2015-09-24 ENCOUNTER — Telehealth: Payer: Self-pay | Admitting: Adult Health

## 2015-09-24 NOTE — Telephone Encounter (Signed)
Pt complains about weight gain on megace, she missed F/U appt in Fall, to make appt for this Friday

## 2015-09-27 ENCOUNTER — Ambulatory Visit: Payer: BLUE CROSS/BLUE SHIELD | Admitting: Adult Health

## 2015-10-14 ENCOUNTER — Ambulatory Visit (HOSPITAL_COMMUNITY): Payer: BLUE CROSS/BLUE SHIELD

## 2015-10-24 ENCOUNTER — Other Ambulatory Visit: Payer: BLUE CROSS/BLUE SHIELD | Admitting: Adult Health

## 2015-11-11 ENCOUNTER — Other Ambulatory Visit: Payer: BLUE CROSS/BLUE SHIELD | Admitting: Adult Health

## 2015-12-03 ENCOUNTER — Other Ambulatory Visit: Payer: Self-pay | Admitting: Adult Health

## 2015-12-03 DIAGNOSIS — Z1231 Encounter for screening mammogram for malignant neoplasm of breast: Secondary | ICD-10-CM

## 2015-12-09 ENCOUNTER — Ambulatory Visit (HOSPITAL_COMMUNITY): Payer: BLUE CROSS/BLUE SHIELD

## 2015-12-16 ENCOUNTER — Other Ambulatory Visit: Payer: Self-pay | Admitting: *Deleted

## 2015-12-16 ENCOUNTER — Ambulatory Visit (HOSPITAL_COMMUNITY): Payer: BLUE CROSS/BLUE SHIELD

## 2015-12-17 MED ORDER — MEGESTROL ACETATE 40 MG PO TABS: ORAL_TABLET | ORAL | Status: DC

## 2015-12-20 ENCOUNTER — Ambulatory Visit (HOSPITAL_COMMUNITY)
Admission: RE | Admit: 2015-12-20 | Discharge: 2015-12-20 | Disposition: A | Discharge status: Home / Self Care | Payer: BLUE CROSS/BLUE SHIELD | Source: Ambulatory Visit | Attending: Adult Health | Admitting: Adult Health

## 2015-12-20 DIAGNOSIS — Z1231 Encounter for screening mammogram for malignant neoplasm of breast: Secondary | ICD-10-CM | POA: Insufficient documentation

## 2016-01-01 ENCOUNTER — Other Ambulatory Visit: Payer: BLUE CROSS/BLUE SHIELD | Admitting: Adult Health

## 2016-02-10 ENCOUNTER — Other Ambulatory Visit: Payer: BLUE CROSS/BLUE SHIELD | Admitting: Adult Health

## 2016-03-02 ENCOUNTER — Other Ambulatory Visit: Payer: BLUE CROSS/BLUE SHIELD | Admitting: Adult Health

## 2016-03-02 ENCOUNTER — Encounter: Payer: Self-pay | Admitting: *Deleted

## 2016-03-21 ENCOUNTER — Emergency Department (HOSPITAL_COMMUNITY)
Admission: EM | Admit: 2016-03-21 | Discharge: 2016-03-21 | Disposition: A | Discharge status: Home / Self Care | Payer: BLUE CROSS/BLUE SHIELD | Attending: Emergency Medicine | Admitting: Emergency Medicine

## 2016-03-21 ENCOUNTER — Encounter (HOSPITAL_COMMUNITY): Payer: Self-pay | Admitting: Emergency Medicine

## 2016-03-21 ENCOUNTER — Emergency Department (HOSPITAL_COMMUNITY): Payer: BLUE CROSS/BLUE SHIELD

## 2016-03-21 DIAGNOSIS — R600 Localized edema: Secondary | ICD-10-CM | POA: Insufficient documentation

## 2016-03-21 DIAGNOSIS — Z87891 Personal history of nicotine dependence: Secondary | ICD-10-CM | POA: Diagnosis not present

## 2016-03-21 DIAGNOSIS — I739 Peripheral vascular disease, unspecified: Secondary | ICD-10-CM | POA: Diagnosis present

## 2016-03-21 DIAGNOSIS — Z79899 Other long term (current) drug therapy: Secondary | ICD-10-CM | POA: Insufficient documentation

## 2016-03-21 DIAGNOSIS — R609 Edema, unspecified: Secondary | ICD-10-CM

## 2016-03-21 LAB — CBC WITH DIFFERENTIAL/PLATELET
BASOS PCT: 1 %
Basophils Absolute: 0 10*3/uL (ref 0.0–0.1)
EOS ABS: 0.2 10*3/uL (ref 0.0–0.7)
Eosinophils Relative: 4 %
HCT: 32.9 % — ABNORMAL LOW (ref 36.0–46.0)
HEMOGLOBIN: 10.7 g/dL — AB (ref 12.0–15.0)
LYMPHS ABS: 1.1 10*3/uL (ref 0.7–4.0)
Lymphocytes Relative: 29 %
MCH: 24.5 pg — ABNORMAL LOW (ref 26.0–34.0)
MCHC: 32.5 g/dL (ref 30.0–36.0)
MCV: 75.5 fL — ABNORMAL LOW (ref 78.0–100.0)
Monocytes Absolute: 0.5 10*3/uL (ref 0.1–1.0)
Monocytes Relative: 13 %
NEUTROS PCT: 53 %
Neutro Abs: 2 10*3/uL (ref 1.7–7.7)
Platelets: 252 10*3/uL (ref 150–400)
RBC: 4.36 MIL/uL (ref 3.87–5.11)
RDW: 17.4 % — ABNORMAL HIGH (ref 11.5–15.5)
WBC: 3.7 10*3/uL — AB (ref 4.0–10.5)

## 2016-03-21 LAB — TROPONIN I: Troponin I: <0.03 ng/mL (ref <0.03)

## 2016-03-21 LAB — BASIC METABOLIC PANEL
ANION GAP: 3 — AB (ref 5–15)
BUN: 6 mg/dL (ref 6–20)
CALCIUM: 8.5 mg/dL — AB (ref 8.9–10.3)
CO2: 24 mmol/L (ref 22–32)
Chloride: 112 mmol/L — ABNORMAL HIGH (ref 101–111)
Creatinine, Ser: 0.6 mg/dL (ref 0.44–1.00)
GFR calc non Af Amer: >60 mL/min (ref >60)
Glucose, Bld: 97 mg/dL (ref 65–99)
Potassium: 3.7 mmol/L (ref 3.5–5.1)
Sodium: 139 mmol/L (ref 135–145)

## 2016-03-21 LAB — MAGNESIUM: MAGNESIUM: 2 mg/dL (ref 1.7–2.4)

## 2016-03-21 MED ORDER — HYDROCHLOROTHIAZIDE 25 MG PO TABS: 25.0000 mg | ORAL_TABLET | Freq: Every day | ORAL | Status: DC

## 2016-03-21 NOTE — Discharge Instructions (Signed)

## 2016-03-21 NOTE — ED Provider Notes (Signed)
CSN: [id number]     Arrival date & time 03/21/16  1258 History   First MD Initiated Contact with Patient 03/21/16 1324     Chief Complaint  Patient presents with  . Claudication   Pt is a 45 yo bf with only pmhx of menorrhagia.  She said that she has noticed some swelling in bilateral lower extremities.  She said that she has ache in her legs when she walks.  Pt does work long shifts on her feet.  She denies any sob or cp.  Pt has a hx of a congenital left knee injury and she has pain there as well.  (Consider location/radiation/quality/duration/timing/severity/associated sxs/prior Treatment) The history is provided by the patient.    Past Medical History  Diagnosis Date  . Anemia   . Menorrhagia 08/24/2014  . Thickened endometrium 10/04/2014    ?polyp to see Dr Elonda Husky about [facility] and ablation  . Fibroids 10/04/2014  . Endometrial polyp 10/04/2014   Past Surgical History  Procedure Laterality Date  . Cesarean section      x3  . Knee surgery Left     removal of bone-from being bow-legged- done X2.  . Tubal ligation    . Wisdom tooth extraction    . Dilitation & currettage/hystroscopy with novasure ablation N/A 10/24/2014    Procedure: HYSTEROSCOPY WITH NOVASURE ENDOMETRIAL ABLATION;  Surgeon: Florian Buff, MD;  Location: [facility];  Service: Gynecology;  Laterality: N/A;  Uterine Cavity Length 5.5cm Uterine Cavity Width 4.5cm power = 136 watts time = 55 seconds   Family History  Problem Relation Age of Onset  . Diabetes Mother   . Hypertension Father   . Other Father     has had open heart surgery; has one kidney  . Hypertension Sister   . Diabetes Brother   . Anxiety disorder Daughter   . Bipolar disorder Son   . Schizophrenia Son   . Diabetes Maternal Grandmother   . Kidney disease Maternal Grandfather   . Cancer Paternal Grandmother     breast  . Cancer Sister     breast  . Diabetes Brother     borderline  . Seizures Son    Social History  Substance Use Topics  .  Smoking status: Former Smoker -- 0.25 packs/day for 10 years    Types: Cigarettes    Quit date: 10/22/2007  . Smokeless tobacco: Never Used  . Alcohol Use: Yes     Comment: occ   OB History    Gravida Para Term Preterm AB TAB SAB Ectopic Multiple Living   4 3   1 1    3      Review of Systems  Musculoskeletal:       Bilateral lower extremity pain and swelling.  All other systems reviewed and are negative.     Allergies  Review of patient's allergies indicates no known allergies.  Home Medications   Prior to Admission medications   Medication Sig Start Date End Date Taking? Authorizing Provider  megestrol (MEGACE) 40 MG tablet Take 1 to 2 tablets by mouth every day as directed 12/17/15  Yes Florian Buff, MD  hydrochlorothiazide (HYDRODIURIL) 25 MG tablet Take 1 tablet (25 mg total) by mouth daily. 03/21/16   Isla Pence, MD   BP 99/46 mmHg  Pulse 76  Temp(Src) 97.6 F (36.4 C) (Temporal)  Resp 19  Ht 5\' 1"  (1.549 m)  Wt 243 lb 9.6 oz (110.496 kg)  BMI 46.05 kg/m2  SpO2 100% Physical  Exam  Constitutional: She is oriented to person, place, and time. She appears well-developed and well-nourished.  HENT:  Head: Normocephalic and atraumatic.  Right Ear: External ear normal.  Left Ear: External ear normal.  Nose: Nose normal.  Mouth/Throat: Oropharynx is clear and moist.  Eyes: Conjunctivae and EOM are normal. Pupils are equal, round, and reactive to light.  Neck: Normal range of motion. Neck supple.  Cardiovascular: Normal rate, regular rhythm, normal heart sounds and intact distal pulses.   Pulmonary/Chest: Effort normal and breath sounds normal.  Abdominal: Soft. Bowel sounds are normal.  Musculoskeletal: Normal range of motion. She exhibits edema.  Neurological: She is alert and oriented to person, place, and time.  Skin: Skin is warm and dry.  Psychiatric: She has a normal mood and affect. Her behavior is normal. Judgment and thought content normal.  Nursing  note and vitals reviewed.   ED Course  Procedures (including critical care time) Labs Review Labs Reviewed  BASIC METABOLIC PANEL - Abnormal; Notable for the following:    Chloride 112 (*)    Calcium 8.5 (*)    Anion gap 3 (*)    All other components within normal limits  CBC WITH DIFFERENTIAL/PLATELET - Abnormal; Notable for the following:    WBC 3.7 (*)    Hemoglobin 10.7 (*)    HCT 32.9 (*)    MCV 75.5 (*)    MCH 24.5 (*)    RDW 17.4 (*)    All other components within normal limits  TROPONIN I  MAGNESIUM  URINALYSIS, ROUTINE W REFLEX MICROSCOPIC (NOT AT [facility])    Imaging Review Dg Knee Complete 4 Views Left  03/21/2016  CLINICAL DATA:  Acute left knee pain, history of remote left extremity trauma and prior orthopedic surgery. EXAM: LEFT KNEE - COMPLETE 4+ VIEW COMPARISON:  None available FINDINGS: Chronic downsloping deformity of the proximal tibia medial plateau from remote healed trauma. Associated degenerative changes of the left knee joint. No acute osseous finding, malalignment or effusion. 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 IMPRESSION: Chronic changes of the left tibia and fibula from remote trauma and healed deformities. Secondary degenerative changes of the left knee joint. No acute osseous finding by plain radiography Electronically Signed   By: Jerilynn Mages.  Shick M.D.   On: 03/21/2016 14:51   I have personally reviewed and evaluated these images and lab results as part of my medical decision-making.   EKG Interpretation   Date/Time:  Saturday March 21 2016 13:53:08 EDT Ventricular Rate:  89 PR Interval:    QRS Duration: 101 QT Interval:  349 QTC Calculation: 425 R Axis:   -49 Text Interpretation:  Sinus rhythm Incomplete RBBB and LAFB RSR' in V1 or  V2, right VCD or RVH Confirmed by [facility] MD, Nyree Yonker [id number]) on 03/21/2016  2:12:59 PM      MDM  [place] is gone for the day, so I ordered the [place] to eval for DVT for tomorrow morning at 0900.  The pt  knows to return for that.  I have a very low suspicion of DVT, so I did not give her a dose of lovenox prior to discharge.  I did start pt on HCTZ to see if that would help with the swelling.  I encouraged her to continue to exercise and to eat a healthy diet.  Pt knows to return for worsening of sx.  Final diagnoses:  Peripheral edema      Isla Pence, MD 03/21/16 1534

## 2016-03-21 NOTE — ED Notes (Signed)
Patient c/o bilateral leg pain, worse with walking. Patient reports swelling and pain in right foot. Denies any injury.

## 2016-03-22 ENCOUNTER — Other Ambulatory Visit (HOSPITAL_COMMUNITY): Payer: Self-pay | Admitting: Emergency Medicine

## 2016-03-22 ENCOUNTER — Inpatient Hospital Stay (HOSPITAL_COMMUNITY): Admit: 2016-03-22 | Payer: BLUE CROSS/BLUE SHIELD

## 2016-03-22 ENCOUNTER — Ambulatory Visit (HOSPITAL_COMMUNITY)
Admission: RE | Admit: 2016-03-22 | Discharge: 2016-03-22 | Disposition: A | Discharge status: Home / Self Care | Payer: BLUE CROSS/BLUE SHIELD | Source: Ambulatory Visit | Attending: Emergency Medicine | Admitting: Emergency Medicine

## 2016-03-22 DIAGNOSIS — M79606 Pain in leg, unspecified: Secondary | ICD-10-CM

## 2016-03-22 DIAGNOSIS — M79605 Pain in left leg: Principal | ICD-10-CM

## 2016-03-22 DIAGNOSIS — M79604 Pain in right leg: Secondary | ICD-10-CM

## 2016-09-17 ENCOUNTER — Ambulatory Visit: Payer: BLUE CROSS/BLUE SHIELD | Admitting: Adult Health

## 2016-11-09 ENCOUNTER — Ambulatory Visit: Payer: BLUE CROSS/BLUE SHIELD | Admitting: Adult Health

## 2016-11-25 ENCOUNTER — Encounter: Payer: Self-pay | Admitting: *Deleted

## 2016-11-25 ENCOUNTER — Ambulatory Visit: Payer: BLUE CROSS/BLUE SHIELD | Admitting: Adult Health

## 2016-11-30 ENCOUNTER — Other Ambulatory Visit: Payer: Self-pay | Admitting: Adult Health

## 2016-11-30 DIAGNOSIS — Z1231 Encounter for screening mammogram for malignant neoplasm of breast: Secondary | ICD-10-CM

## 2016-12-18 ENCOUNTER — Ambulatory Visit: Payer: BLUE CROSS/BLUE SHIELD | Admitting: Adult Health

## 2016-12-18 ENCOUNTER — Encounter: Payer: Self-pay | Admitting: *Deleted

## 2016-12-21 ENCOUNTER — Ambulatory Visit (HOSPITAL_COMMUNITY): Payer: BLUE CROSS/BLUE SHIELD

## 2016-12-31 ENCOUNTER — Other Ambulatory Visit: Payer: Self-pay | Admitting: Obstetrics & Gynecology

## 2017-01-11 ENCOUNTER — Ambulatory Visit (HOSPITAL_COMMUNITY): Payer: BLUE CROSS/BLUE SHIELD

## 2017-02-08 ENCOUNTER — Telehealth: Payer: Self-pay | Admitting: Adult Health

## 2017-02-08 MED ORDER — MEGESTROL ACETATE 40 MG PO TABS: 40.0000 mg | ORAL_TABLET | Freq: Two times a day (BID) | ORAL | 0 refills | Status: DC

## 2017-02-08 NOTE — Telephone Encounter (Signed)
Spoke with pt. Pt is requesting a refill on Megace. Pt is on 40 mg, she takes 1-2 tabs daily. Pt wonders if mg can be increased. Please advise. Thanks!! [place]

## 2017-02-08 NOTE — Telephone Encounter (Signed)
Pt called stating that Jodi Chaney usually refills a medication for her. Pt would like a call back from the nurse. Please contact pt

## 2017-02-08 NOTE — Telephone Encounter (Signed)
Refilled megace, but needs appt, has missed several

## 2017-03-08 ENCOUNTER — Ambulatory Visit (HOSPITAL_COMMUNITY): Payer: BLUE CROSS/BLUE SHIELD

## 2017-03-12 ENCOUNTER — Ambulatory Visit (HOSPITAL_COMMUNITY)
Admission: RE | Admit: 2017-03-12 | Discharge: 2017-03-12 | Disposition: A | Discharge status: Home / Self Care | Payer: BLUE CROSS/BLUE SHIELD | Source: Ambulatory Visit | Attending: Adult Health | Admitting: Adult Health

## 2017-03-12 DIAGNOSIS — Z1231 Encounter for screening mammogram for malignant neoplasm of breast: Secondary | ICD-10-CM | POA: Insufficient documentation

## 2017-12-08 ENCOUNTER — Other Ambulatory Visit: Payer: Self-pay

## 2017-12-08 ENCOUNTER — Encounter (HOSPITAL_COMMUNITY): Payer: Self-pay

## 2017-12-08 ENCOUNTER — Emergency Department (HOSPITAL_COMMUNITY)
Admission: EM | Admit: 2017-12-08 | Discharge: 2017-12-08 | Disposition: A | Discharge status: Home / Self Care | Payer: BLUE CROSS/BLUE SHIELD | Attending: Emergency Medicine | Admitting: Emergency Medicine

## 2017-12-08 DIAGNOSIS — R070 Pain in throat: Secondary | ICD-10-CM | POA: Diagnosis present

## 2017-12-08 DIAGNOSIS — Z79899 Other long term (current) drug therapy: Secondary | ICD-10-CM | POA: Insufficient documentation

## 2017-12-08 DIAGNOSIS — B9789 Other viral agents as the cause of diseases classified elsewhere: Secondary | ICD-10-CM | POA: Insufficient documentation

## 2017-12-08 DIAGNOSIS — J069 Acute upper respiratory infection, unspecified: Secondary | ICD-10-CM

## 2017-12-08 DIAGNOSIS — Z87891 Personal history of nicotine dependence: Secondary | ICD-10-CM | POA: Diagnosis not present

## 2017-12-08 DIAGNOSIS — J029 Acute pharyngitis, unspecified: Secondary | ICD-10-CM

## 2017-12-08 LAB — RAPID STREP SCREEN (MED CTR MEBANE ONLY): Streptococcus, Group A Screen (Direct): NEGATIVE

## 2017-12-08 MED ORDER — PROMETHAZINE-DM 6.25-15 MG/5ML PO SYRP: 5.0000 mL | ORAL_SOLUTION | Freq: Four times a day (QID) | ORAL | 0 refills | Status: DC | PRN

## 2017-12-08 MED ORDER — FLUTICASONE PROPIONATE 50 MCG/ACT NA SUSP: 1.0000 | Freq: Every day | NASAL | 0 refills | Status: DC

## 2017-12-08 MED ORDER — BENZONATATE 100 MG PO CAPS: 100.0000 mg | ORAL_CAPSULE | Freq: Three times a day (TID) | ORAL | 0 refills | Status: DC

## 2017-12-08 NOTE — ED Triage Notes (Signed)
Pt reports sore throat for the past few days.

## 2017-12-08 NOTE — Discharge Instructions (Addendum)
You likely have a viral illness.  This should be treated symptomatically. Use Tylenol or ibuprofen as needed for fevers or body aches. Use Flonase daily for nasal congestion and cough. Use cough syrup and cough drops as needed.  Use sore throat spray.  Make sure you stay well-hydrated with water. Wash your hands frequently to prevent spread of infection. Follow-up with your primary care doctor in 1 week if your symptoms are not improving. Return to the emergency room if you develop chest pain, difficulty breathing, or any new or worsening symptoms.

## 2017-12-08 NOTE — ED Provider Notes (Signed)
[facility] EMERGENCY DEPARTMENT Provider Note   CSN: [id number] Arrival date & time: 12/08/17  2423     History   Chief Complaint Chief Complaint  Patient presents with  . Sore Throat    HPI Jodi Chaney is a 47 y.o. female presenting for evaluation of sore throat and cough.  Patient states 3 days ago she started to develop a sore throat.  She developed associated productive cough, worse at night.  She has nasal congestion.  She is unsure if she had fevers. She denies ear pain, eye pain, sinus pressure, difficulty breathing, difficulty handling secretions, chest pain, shortness of breath, nausea, vomiting, abdominal pain.  No history of asthma or COPD.  She does not smoke cigarettes.  No sick contacts.  She has not taken anything for her symptoms. Take megace for menorrhagia, no other medical problems.   HPI  Past Medical History:  Diagnosis Date  . Anemia   . Endometrial polyp 10/04/2014  . Fibroids 10/04/2014  . Menorrhagia 08/24/2014  . Thickened endometrium 10/04/2014   ?polyp to see Dr Elonda Husky about [facility] and ablation    Patient Active Problem List   Diagnosis Date Noted  . Thickened endometrium 10/04/2014  . Fibroids 10/04/2014  . Endometrial polyp 10/04/2014  . Anemia 08/24/2014  . Menorrhagia 08/24/2014    Past Surgical History:  Procedure Laterality Date  . CESAREAN SECTION     x3  . DILITATION & CURRETTAGE/HYSTROSCOPY WITH NOVASURE ABLATION N/A 10/24/2014   Procedure: HYSTEROSCOPY WITH NOVASURE ENDOMETRIAL ABLATION;  Surgeon: Florian Buff, MD;  Location: [facility];  Service: Gynecology;  Laterality: N/A;  Uterine Cavity Length 5.5cm Uterine Cavity Width 4.5cm power = 136 watts time = 55 seconds  . KNEE SURGERY Left    removal of bone-from being bow-legged- done X2.  . TUBAL LIGATION    . WISDOM TOOTH EXTRACTION       OB History    Gravida  4   Para  3   Term      Preterm      AB  1   Living  3     SAB      TAB  1   Ectopic      Multiple      Live Births               Home Medications    Prior to Admission medications   Medication Sig Start Date End Date Taking? Authorizing Provider  benzonatate (TESSALON) 100 MG capsule Take 1 capsule (100 mg total) by mouth every 8 (eight) hours. 12/08/17   Shawni Volkov, PA-C  fluticasone (FLONASE) 50 MCG/ACT nasal spray Place 1 spray into both nostrils daily. 12/08/17   Christien Frankl, PA-C  hydrochlorothiazide (HYDRODIURIL) 25 MG tablet Take 1 tablet (25 mg total) by mouth daily. 03/21/16   Isla Pence, MD  megestrol (MEGACE) 40 MG tablet Take 1 tablet (40 mg total) by mouth 2 (two) times daily. 02/08/17   Estill Dooms, NP  promethazine-dextromethorphan (PROMETHAZINE-DM) 6.25-15 MG/5ML syrup Take 5 mLs by mouth 4 (four) times daily as needed for cough. 12/08/17   Ricketta Colantonio, PA-C    Family History Family History  Problem Relation Age of Onset  . Diabetes Mother   . Hypertension Father   . Other Father        has had open heart surgery; has one kidney  . Hypertension Sister   . Diabetes Brother   . Anxiety disorder Daughter   . Bipolar  disorder Son   . Schizophrenia Son   . Diabetes Maternal Grandmother   . Kidney disease Maternal Grandfather   . Cancer Paternal Grandmother        breast  . Cancer Sister        breast  . Diabetes Brother        borderline  . Seizures Son     Social History Social History   Tobacco Use  . Smoking status: Former Smoker    Packs/day: 0.25    Years: 10.00    Pack years: 2.50    Types: Cigarettes    Last attempt to quit: 10/22/2007    Years since quitting: 10.1  . Smokeless tobacco: Never Used  Substance Use Topics  . Alcohol use: Yes    Comment: occ  . Drug use: No     Allergies   Patient has no known allergies.   Review of Systems Review of Systems  Constitutional: Negative for chills.  HENT: Positive for congestion, rhinorrhea and sore throat. Negative for ear pain, sinus pain, trouble  swallowing and voice change.   Eyes: Negative for pain.  Respiratory: Positive for cough. Negative for chest tightness and shortness of breath.   Cardiovascular: Negative for chest pain.  Gastrointestinal: Negative for abdominal pain, nausea and vomiting.     Physical Exam Updated Vital Signs BP (!) 100/53   Pulse 95   Temp 98.8 F (37.1 C) (Oral)   Resp 18   Ht 5' (1.524 m)   Wt 106.1 kg (234 lb)   SpO2 99%   BMI 45.70 kg/m   Physical Exam  Constitutional: She is oriented to person, place, and time. She appears well-developed and well-nourished. No distress.  HENT:  Head: Normocephalic and atraumatic.  Right Ear: Tympanic membrane, external ear and ear canal normal.  Left Ear: Tympanic membrane, external ear and ear canal normal.  Nose: Mucosal edema present. Right sinus exhibits no maxillary sinus tenderness. Left sinus exhibits no maxillary sinus tenderness.  Mouth/Throat: Uvula is midline and mucous membranes are normal. Posterior oropharyngeal erythema present. No oropharyngeal exudate or posterior oropharyngeal edema. No tonsillar exudate.  Nasal mucosal edema.  OP mildly erythematous without tonsillar swelling or exudate.  Uvula midline with equal palate rise.  TMs nonerythematous and nonbulging bilaterally.  No tenderness to palpation of the sinuses. Handling secretions easily. No trismus. No hot potato voice  Eyes: Pupils are equal, round, and reactive to light. Conjunctivae and EOM are normal.  Neck: Normal range of motion.  Cardiovascular: Normal rate, regular rhythm and intact distal pulses.  Pulmonary/Chest: Effort normal and breath sounds normal. No respiratory distress. She has no decreased breath sounds. She has no wheezes. She has no rhonchi. She has no rales.  Pt speaking in full sentences. Clear lung sounds in all fields.  Abdominal: Soft. She exhibits no distension. There is no tenderness.  Musculoskeletal: Normal range of motion.  Lymphadenopathy:    She has  cervical adenopathy.  Neurological: She is alert and oriented to person, place, and time.  Skin: Skin is warm.  Psychiatric: She has a normal mood and affect.  Nursing note and vitals reviewed.    ED Treatments / Results  Labs (all labs ordered are listed, but only abnormal results are displayed) Labs Reviewed  RAPID STREP SCREEN (NOT AT [facility])  CULTURE, GROUP A STREP [facility])    EKG None  Radiology No results found.  Procedures Procedures (including critical care time)  Medications Ordered in ED Medications - No data  to display   Initial Impression / Assessment and Plan / ED Course  I have reviewed the triage vital signs and the nursing notes.  Pertinent labs & imaging results that were available during my care of the patient were reviewed by me and considered in my medical decision making (see chart for details).     Patient presenting with 3 days h/o sore throat and cough.  Physical exam reassuring, patient is afebrile and appears nontoxic.  Pulmonary exam reassuring.  Doubt pneumonia, strep, other bacterial infection, or peritonsillar abscess. However pt is concerned she has strep, will test.   Rapid strep negative. Likely viral URI.  Will treat symptomatically.  Patient to follow-up with primary care as needed.  At this time, patient appears safe for discharge.  Return precautions given.  Patient states she understands and agrees to plan.   Final Clinical Impressions(s) / ED Diagnoses   Final diagnoses:  Sore throat  Viral URI with cough    ED Discharge Orders        Ordered    fluticasone (FLONASE) 50 MCG/ACT nasal spray  Daily     12/08/17 0934    benzonatate (TESSALON) 100 MG capsule  Every 8 hours     12/08/17 0934    promethazine-dextromethorphan (PROMETHAZINE-DM) 6.25-15 MG/5ML syrup  4 times daily PRN     12/08/17 0934       Franchot Heidelberg, PA-C 12/08/17 0935    Julianne Rice, MD 12/08/17 1259

## 2017-12-10 LAB — CULTURE, GROUP A STREP (THRC)

## 2018-02-08 ENCOUNTER — Other Ambulatory Visit: Payer: Self-pay | Admitting: Obstetrics & Gynecology

## 2018-04-04 ENCOUNTER — Other Ambulatory Visit: Payer: Self-pay | Admitting: Adult Health

## 2018-04-04 DIAGNOSIS — Z1231 Encounter for screening mammogram for malignant neoplasm of breast: Secondary | ICD-10-CM

## 2018-04-11 ENCOUNTER — Ambulatory Visit (HOSPITAL_COMMUNITY)
Admission: RE | Admit: 2018-04-11 | Discharge: 2018-04-11 | Disposition: A | Discharge status: Home / Self Care | Payer: BLUE CROSS/BLUE SHIELD | Source: Ambulatory Visit | Attending: Adult Health | Admitting: Adult Health

## 2018-04-11 DIAGNOSIS — Z1231 Encounter for screening mammogram for malignant neoplasm of breast: Secondary | ICD-10-CM | POA: Insufficient documentation

## 2018-10-05 ENCOUNTER — Other Ambulatory Visit: Payer: Self-pay

## 2018-10-05 ENCOUNTER — Ambulatory Visit (INDEPENDENT_AMBULATORY_CARE_PROVIDER_SITE_OTHER): Payer: BLUE CROSS/BLUE SHIELD | Admitting: Adult Health

## 2018-10-05 ENCOUNTER — Other Ambulatory Visit (HOSPITAL_COMMUNITY)
Admission: RE | Admit: 2018-10-05 | Discharge: 2018-10-05 | Disposition: A | Discharge status: Home / Self Care | Payer: BLUE CROSS/BLUE SHIELD | Source: Ambulatory Visit | Attending: Adult Health | Admitting: Adult Health

## 2018-10-05 ENCOUNTER — Encounter: Payer: Self-pay | Admitting: Adult Health

## 2018-10-05 VITALS — BP 118/65 | HR 79 | Ht 60.0 in | Wt 250.0 lb

## 2018-10-05 DIAGNOSIS — Z1211 Encounter for screening for malignant neoplasm of colon: Secondary | ICD-10-CM | POA: Diagnosis not present

## 2018-10-05 DIAGNOSIS — Z01419 Encounter for gynecological examination (general) (routine) without abnormal findings: Secondary | ICD-10-CM | POA: Diagnosis not present

## 2018-10-05 DIAGNOSIS — Z1212 Encounter for screening for malignant neoplasm of rectum: Secondary | ICD-10-CM | POA: Diagnosis not present

## 2018-10-05 DIAGNOSIS — R195 Other fecal abnormalities: Secondary | ICD-10-CM

## 2018-10-05 LAB — HEMOCCULT GUIAC POC 1CARD (OFFICE): FECAL OCCULT BLD: POSITIVE — AB

## 2018-10-05 MED ORDER — MEGESTROL ACETATE 40 MG PO TABS: ORAL_TABLET | ORAL | 0 refills | Status: DC

## 2018-10-05 NOTE — Progress Notes (Signed)
Patient ID: Jodi Chaney, female   DOB: 1970/12/04, 48 y.o.   MRN: [id number] History of Present Illness: Jodi Chaney is a 48 year old black female, in for well woman gyn exam and pap. PCP is Dr Nevada Crane.   Current Medications, Allergies, Past Medical History, Past Surgical History, Family History and Social History were reviewed in [employer] record.     Review of Systems: Patient denies any headaches, hearing loss, fatigue, blurred vision, shortness of breath, chest pain, abdominal pain, problems with bowel movements, urination, or intercourse(not active). No joint pain or mood swings. Has 3-4 days of bleeding most months, sp ablation, will use megace to stop bleeding prn.  She has been doing iso tea cleanse.    Physical Exam: BP 118/65 (BP Location: Left Arm, Patient Position: Sitting, Cuff Size: Normal)   Pulse 79   Ht 5' (1.524 m)   Wt 250 lb (113.4 kg)   BMI 48.82 kg/m  General:  Well developed, well nourished, no acute distress Skin:  Warm and dry Neck:  Midline trachea, normal thyroid, good ROM, no lymphadenopathy Lungs; Clear to auscultation bilaterally Breast:  No dominant palpable mass, retraction, or nipple discharge Cardiovascular: Regular rate and rhythm Abdomen:  Soft, non tender, no hepatosplenomegaly Pelvic:  External genitalia is normal in appearance, no lesions.  The vagina is normal in appearance,+dark blood in vault. Urethra has no lesions or masses. The cervix is bulbous. Pap with HPV and GC/CHL performed. Uterus is felt to be normal size, shape, and contour.  No adnexal masses or tenderness noted.Bladder is non tender, no masses felt. Rectal: Good sphincter tone, no polyps, or hemorrhoids felt.  Hemoccult positive.. Extremities/musculoskeletal:  No varicosities noted, no clubbing or cyanosis.mild swelling left knew, had replacement years ago.  Psych:  No mood changes, alert and cooperative,seems happy Fall risk is low. PHQ 2 score  0. Examination chaperoned by Estill Bamberg Rash LPN.  Impression: 1. Encounter for gynecological examination with Papanicolaou smear of cervix   2. Screening for colorectal cancer   3. Positive fecal occult blood test       Plan: 3 Hemoccult cards sent home to do, if any positive, will refer to GI, but stop iso tea cleanse before doing.  Labs with PCP Physical in 1 year Pap in 3 if normal Mammogram yearly Meds ordered this encounter  Medications  . megestrol (MEGACE) 40 MG tablet    Sig: TAKE 1 TO 2 TABLETS BY MOUTH EVERY DAY AS DIRECTED    Dispense:  180 tablet    Refill:  0    Order Specific Question:   Supervising Provider    Answer:   Tania Ade H [[id number]]

## 2018-10-07 LAB — CYTOLOGY - PAP
Adequacy: ABSENT
CHLAMYDIA, DNA PROBE: NEGATIVE
DIAGNOSIS: NEGATIVE
HPV (WINDOPATH): NOT DETECTED
NEISSERIA GONORRHEA: NEGATIVE

## 2018-11-04 ENCOUNTER — Ambulatory Visit: Payer: BLUE CROSS/BLUE SHIELD | Admitting: Adult Health

## 2018-11-04 ENCOUNTER — Encounter: Payer: Self-pay | Admitting: *Deleted

## 2019-03-07 ENCOUNTER — Other Ambulatory Visit: Payer: Self-pay

## 2019-03-07 ENCOUNTER — Other Ambulatory Visit: Payer: Self-pay | Admitting: Sports Medicine

## 2019-03-07 ENCOUNTER — Ambulatory Visit (INDEPENDENT_AMBULATORY_CARE_PROVIDER_SITE_OTHER): Payer: BC Managed Care – PPO

## 2019-03-07 ENCOUNTER — Ambulatory Visit: Payer: BC Managed Care – PPO | Admitting: Sports Medicine

## 2019-03-07 ENCOUNTER — Encounter: Payer: Self-pay | Admitting: Sports Medicine

## 2019-03-07 VITALS — BP 92/48 | HR 84 | Temp 97.9°F | Resp 16

## 2019-03-07 DIAGNOSIS — M2550 Pain in unspecified joint: Secondary | ICD-10-CM | POA: Diagnosis not present

## 2019-03-07 DIAGNOSIS — M79672 Pain in left foot: Secondary | ICD-10-CM

## 2019-03-07 DIAGNOSIS — M722 Plantar fascial fibromatosis: Secondary | ICD-10-CM

## 2019-03-07 DIAGNOSIS — L603 Nail dystrophy: Secondary | ICD-10-CM | POA: Diagnosis not present

## 2019-03-07 DIAGNOSIS — M2142 Flat foot [pes planus] (acquired), left foot: Secondary | ICD-10-CM

## 2019-03-07 DIAGNOSIS — M2141 Flat foot [pes planus] (acquired), right foot: Secondary | ICD-10-CM

## 2019-03-07 MED ORDER — CELECOXIB 200 MG PO CAPS: 200.0000 mg | ORAL_CAPSULE | Freq: Two times a day (BID) | ORAL | 0 refills | Status: DC

## 2019-03-07 NOTE — Patient Instructions (Signed)

## 2019-03-07 NOTE — Progress Notes (Signed)
Subjective: Jodi Chaney is a 48 y.o. female patient presents to office with complaint of moderate heel pain on the left for the last 2 weeks that started after she changed her insoles in her work boots. Patient has tried rest and does not want an injection like her sister said she would need. Patient also admits to knee pain and discoloration of the right 2nd toenail. Denies any other pedal complaints.   Review of Systems  Musculoskeletal: Positive for joint pain and myalgias.  All other systems reviewed and are negative.   Patient Active Problem List   Diagnosis Date Noted  . Encounter for gynecological examination with Papanicolaou smear of cervix 10/05/2018  . Screening for colorectal cancer 10/05/2018  . Positive fecal occult blood test 10/05/2018  . Thickened endometrium 10/04/2014  . Fibroids 10/04/2014  . Endometrial polyp 10/04/2014  . Anemia 08/24/2014  . Menorrhagia 08/24/2014    Current Outpatient Medications on File Prior to Visit  Medication Sig Dispense Refill  . acetaminophen-codeine (TYLENOL #3) 300-30 MG tablet Take 1 tablet by mouth every 8 (eight) hours.    . diclofenac (VOLTAREN) 75 MG EC tablet     . furosemide (LASIX) 20 MG tablet Take 20 mg by mouth daily.    . hydrochlorothiazide (HYDRODIURIL) 25 MG tablet Take 1 tablet (25 mg total) by mouth daily. 30 tablet 0  . ibuprofen (ADVIL) 800 MG tablet Take 800 mg by mouth every 8 (eight) hours as needed. for pain    . methocarbamol (ROBAXIN) 500 MG tablet Take 500 mg by mouth every 8 (eight) hours as needed.    . phentermine (ADIPEX-P) 37.5 MG tablet Take 37.5 mg by mouth daily.     No current facility-administered medications on file prior to visit.     No Known Allergies  Objective: Physical Exam General: The patient is alert and oriented x3 in no acute distress.  Dermatology: Skin is warm, dry and supple bilateral lower extremities. Nails 1-10 are normal with mild discoloration at right 2nd toenail  likely mechanical since this toe is long. There is no erythema, edema, no eccymosis, no open lesions present. Integument is otherwise unremarkable.  Vascular: Dorsalis Pedis pulse and Posterior Tibial pulse are 2/4 bilateral. Capillary fill time is immediate to all digits.  Neurological: Grossly intact to light touch with an achilles reflex of +2/5 and a  negative Tinel's sign bilateral.  Musculoskeletal: Very mild tenderness to palpation at the medial calcaneal tubercale and through the insertion of the plantar fascia and at achilles insertion on the left.. No pain with compression of calcaneus bilateral. No pain with tuning fork to calcaneus bilateral. No pain with calf compression bilateral. There is decreased Ankle joint range of motion bilateral. All other joints range of motion within normal limits bilateral. Pes planus bilateral. Strength 5/5 in all groups bilateral.   Xray, Left foot:  Normal osseous mineralization. Joint spaces preserved except midtarsal where there is breach supportive of pes planus. No fracture/dislocation/boney destruction. Calcaneal spur present with mild thickening of plantar fascia. No other soft tissue abnormalities or radiopaque foreign bodies.   Assessment and Plan: Problem List Items Addressed This Visit    None    Visit Diagnoses    Left foot pain    -  Primary   Relevant Medications   celecoxib (CELEBREX) 200 MG capsule   Other Relevant Orders   DG Foot Complete Left   Plantar fasciitis       Relevant Medications   celecoxib (CELEBREX) 200  MG capsule   Inflammatory pain of left heel       Relevant Medications   celecoxib (CELEBREX) 200 MG capsule   Arthralgia, unspecified joint       Relevant Medications   celecoxib (CELEBREX) 200 MG capsule   Nail dystrophy       Pes planus of both feet          -Complete examination performed.  -Xrays reviewed -Discussed with patient in detail the condition of plantar fasciitis/tendonitis and nail  dystrophy, how this occurs and general treatment options. -Patient declined injection  -Rx celebrex -Recommended good supportive shoes and advised Custom molded orthoses. Office to check benefits and schedule  -Dispensed heel lifts to use meanwhile  -Explained and dispensed to patient daily stretching exercises. -Recommend patient to ice affected area 1-2x daily. -Patient to return to office for orthotics with Liliane Channel or sooner if problems or questions arise.  Landis Martins, DPM

## 2019-03-08 ENCOUNTER — Other Ambulatory Visit: Payer: BC Managed Care – PPO | Admitting: Orthotics

## 2019-03-09 ENCOUNTER — Other Ambulatory Visit: Payer: BC Managed Care – PPO | Admitting: Orthotics

## 2019-03-15 ENCOUNTER — Other Ambulatory Visit: Payer: BC Managed Care – PPO | Admitting: Orthotics

## 2019-03-16 ENCOUNTER — Other Ambulatory Visit: Payer: BC Managed Care – PPO | Admitting: Orthotics

## 2019-03-16 ENCOUNTER — Other Ambulatory Visit: Payer: Self-pay

## 2019-03-16 DIAGNOSIS — M722 Plantar fascial fibromatosis: Secondary | ICD-10-CM

## 2019-03-29 ENCOUNTER — Other Ambulatory Visit: Payer: Self-pay | Admitting: Sports Medicine

## 2019-03-29 DIAGNOSIS — M2550 Pain in unspecified joint: Secondary | ICD-10-CM

## 2019-03-29 DIAGNOSIS — M79672 Pain in left foot: Secondary | ICD-10-CM

## 2019-03-29 DIAGNOSIS — M722 Plantar fascial fibromatosis: Secondary | ICD-10-CM

## 2019-03-30 NOTE — Telephone Encounter (Signed)
Dr. Stover please advice 

## 2019-06-29 ENCOUNTER — Other Ambulatory Visit (HOSPITAL_COMMUNITY): Payer: Self-pay | Admitting: Adult Health

## 2019-06-29 DIAGNOSIS — Z1231 Encounter for screening mammogram for malignant neoplasm of breast: Secondary | ICD-10-CM

## 2019-07-10 ENCOUNTER — Ambulatory Visit (HOSPITAL_COMMUNITY)
Admission: RE | Admit: 2019-07-10 | Discharge: 2019-07-10 | Disposition: A | Discharge status: Home / Self Care | Payer: BC Managed Care – PPO | Source: Ambulatory Visit | Attending: Adult Health | Admitting: Adult Health

## 2019-07-10 ENCOUNTER — Other Ambulatory Visit: Payer: Self-pay

## 2019-07-10 DIAGNOSIS — Z1231 Encounter for screening mammogram for malignant neoplasm of breast: Secondary | ICD-10-CM | POA: Insufficient documentation

## 2019-08-16 ENCOUNTER — Encounter: Payer: Self-pay | Admitting: Internal Medicine

## 2019-09-13 ENCOUNTER — Encounter: Payer: Self-pay | Admitting: Orthopedic Surgery

## 2019-09-13 ENCOUNTER — Other Ambulatory Visit: Payer: Self-pay

## 2019-09-13 ENCOUNTER — Ambulatory Visit: Payer: BC Managed Care – PPO

## 2019-09-13 ENCOUNTER — Ambulatory Visit: Payer: BC Managed Care – PPO | Admitting: Orthopedic Surgery

## 2019-09-13 VITALS — BP 111/63 | HR 89 | Ht 60.0 in | Wt 240.0 lb

## 2019-09-13 DIAGNOSIS — M25562 Pain in left knee: Secondary | ICD-10-CM | POA: Diagnosis not present

## 2019-09-13 DIAGNOSIS — Z6841 Body Mass Index (BMI) 40.0 and over, adult: Secondary | ICD-10-CM | POA: Diagnosis not present

## 2019-09-13 DIAGNOSIS — G8929 Other chronic pain: Secondary | ICD-10-CM | POA: Diagnosis not present

## 2019-09-13 MED ORDER — MELOXICAM 7.5 MG PO TABS: 7.5000 mg | ORAL_TABLET | Freq: Two times a day (BID) | ORAL | 5 refills | Status: DC

## 2019-09-13 NOTE — Progress Notes (Signed)
Jodi Chaney  09/13/2019  Body mass index is 46.87 kg/m.   HISTORY SECTION :  Chief Complaint  Patient presents with  . Knee Pain    left    49 year old female presents for evaluation of left knee pain which started roughly a month or 2 ago.  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  This was done at [facility] we have no records.  She comes in with increasing pain medial side left knee appears to bother her when she stands at work.   Review of Systems  Musculoskeletal: Positive for neck pain.  All other systems reviewed and are negative.    has a past medical history of Anemia, Endometrial polyp (10/04/2014), Fibroids (10/04/2014), Menorrhagia (08/24/2014), and Thickened endometrium (10/04/2014).   Past Surgical History:  Procedure Laterality Date  . CESAREAN SECTION     x3  . DILITATION & CURRETTAGE/HYSTROSCOPY WITH NOVASURE ABLATION N/A 10/24/2014   Procedure: HYSTEROSCOPY WITH NOVASURE ENDOMETRIAL ABLATION;  Surgeon: Florian Buff, MD;  Location: [facility];  Service: Gynecology;  Laterality: N/A;  Uterine Cavity Length 5.5cm Uterine Cavity Width 4.5cm power = 136 watts time = 55 seconds  . KNEE SURGERY Left    removal of bone-from being bow-legged- done X2.  . TUBAL LIGATION    . WISDOM TOOTH EXTRACTION      Body mass index is 46.87 kg/m.   No Known Allergies   Current Outpatient Medications:  .  acetaminophen-codeine (TYLENOL #3) 300-30 MG tablet, Take 1 tablet by mouth every 8 (eight) hours., Disp: , Rfl:  .  celecoxib (CELEBREX) 200 MG capsule, TAKE 1 CAPSULE BY MOUTH TWICE A DAY, Disp: 60 capsule, Rfl: 0 .  diclofenac (VOLTAREN) 75 MG EC tablet, , Disp: , Rfl:  .  furosemide (LASIX) 20 MG tablet, Take 20 mg by mouth daily., Disp: , Rfl:  .  hydrochlorothiazide (HYDRODIURIL) 25 MG tablet, Take 1 tablet (25 mg total) by mouth  daily., Disp: 30 tablet, Rfl: 0 .  ibuprofen (ADVIL) 800 MG tablet, Take 800 mg by mouth every 8 (eight) hours as needed. for pain, Disp: , Rfl:  .  methocarbamol (ROBAXIN) 500 MG tablet, Take 500 mg by mouth every 8 (eight) hours as needed., Disp: , Rfl:  .  phentermine (ADIPEX-P) 37.5 MG tablet, Take 37.5 mg by mouth daily., Disp: , Rfl:    PHYSICAL EXAM SECTION: 1) BP 111/63   Pulse 89   Ht 5' (1.524 m)   Wt 240 lb (108.9 kg)   BMI 46.87 kg/m   Body mass index is 46.87 kg/m. General appearance: Well-developed well-nourished no gross deformities  2) Cardiovascular normal pulse and perfusion in the lower extremities normal color without edema  3) Neurologically deep tendon reflexes are equal and normal, no sensation loss or deficits no pathologic reflexes  4) Psychological: Awake alert and oriented x3 mood and affect normal  5) Skin no lacerations or ulcerations no nodularity no palpable masses, no erythema or nodularity  6) Musculoskeletal: Left knee  2 incisions on the left leg actually.  One is on the medial side one is on the lateral side of the lower leg below the knee joint.  She has tenderness of the medial joint line.  There appears to be varus malalignment.  The patella is centered over the trochlea and seems to track normally.  The knee  is stable.  Her flexion is 125 degrees  Flexion of the right knee 130 degrees with full extension.   MEDICAL DECISION SECTION: Chronic problem with exacerbation, prescription management Encounter Diagnoses  Name Primary?  . Chronic pain of left knee Yes  . Body mass index 45.0-49.9, adult ([place])   . Morbid obesity ([place])     Imaging Ordered x-ray today it shows deformity of the femur and tibia with malalignment of the joint moderate loss and joint space  Plan:  (Rx., Inj., surg., Frx, MRI/CT, XR:2)  Very difficult to issue here.  The patient is overweight.  She is gainfully employed and has done an excellent job with that.   However she needs to lose weight and start some anti-inflammatory medication.  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  I discussed this with her and we both agreed to try anti-inflammatory medication first.  Follow-up 3 months  Recommend meloxicam 1 tablet twice a day she has not had any prior treatment in terms of medication other than occasional ibuprofen  Meds ordered this encounter  Medications  . meloxicam (MOBIC) 7.5 MG tablet    Sig: Take 1 tablet (7.5 mg total) by mouth 2 (two) times daily.    Dispense:  60 tablet    Refill:  5    1:06 PM Arther Abbott, MD  09/13/2019

## 2019-09-13 NOTE — Patient Instructions (Signed)
Weight loss   Start new meds   Come back in 3 months

## 2019-09-15 ENCOUNTER — Encounter: Payer: Self-pay | Admitting: Gastroenterology

## 2019-09-15 ENCOUNTER — Ambulatory Visit: Payer: BC Managed Care – PPO | Admitting: Gastroenterology

## 2019-09-15 ENCOUNTER — Other Ambulatory Visit: Payer: Self-pay

## 2019-09-15 VITALS — BP 120/69 | HR 91 | Temp 99.3°F | Ht 60.0 in | Wt 234.2 lb

## 2019-09-15 DIAGNOSIS — E669 Obesity, unspecified: Secondary | ICD-10-CM | POA: Insufficient documentation

## 2019-09-15 DIAGNOSIS — Z1211 Encounter for screening for malignant neoplasm of colon: Secondary | ICD-10-CM | POA: Diagnosis not present

## 2019-09-15 DIAGNOSIS — Z1212 Encounter for screening for malignant neoplasm of rectum: Secondary | ICD-10-CM | POA: Diagnosis not present

## 2019-09-15 NOTE — Assessment & Plan Note (Signed)
Patient presents to schedule first-ever screening colonoscopy.  Due to her BMI, we will plan for monitored anesthesia care for her sedation.  Currently due to the pandemic and limited bed capacity, we are currently post timing screening exams.  Patient is aware that we will contact her to see the screening exams are started back, to get her scheduled.  Patient was in agreement with plan.

## 2019-09-15 NOTE — Patient Instructions (Signed)
1. We will call you to schedule your screening colonoscopy once the hospital opens back up for screening procedures.

## 2019-09-15 NOTE — Progress Notes (Signed)
Primary Care Physician:  Celene Squibb, MD  Primary Gastroenterologist:  Garfield Cornea, MD   Chief Complaint  Patient presents with  . Colonoscopy    Ov due to BMI    HPI:  Jodi Chaney is a 49 y.o. female here at the request of Dr. Nevada Crane to schedule first-ever screening colonoscopy.  She is brought in for evaluation due to BMI.  Patient states she has no bowel concerns.  No melena or rectal bleeding.  No abdominal pain.  No upper GI symptoms.  Just here to schedule screening colonoscopy as she likes to keep up with preventive health care.  She quit smoking over 10 years ago.  Does not consume any alcohol.  Used to use occasionally.  Quit soft drinks in 2015.  Currently on antibiotic for preparation of dental work.     Current Outpatient Medications  Medication Sig Dispense Refill  . amoxicillin (AMOXIL) 500 MG capsule Take 500 mg by mouth 3 (three) times daily.    . meloxicam (MOBIC) 7.5 MG tablet Take 1 tablet (7.5 mg total) by mouth 2 (two) times daily. 60 tablet 5   No current facility-administered medications for this visit.    Allergies as of 09/15/2019  . (No Known Allergies)    Past Medical History:  Diagnosis Date  . Anemia   . Endometrial polyp 10/04/2014  . Fibroids 10/04/2014  . Menorrhagia 08/24/2014  . Thickened endometrium 10/04/2014   ?polyp to see Dr Elonda Husky about [facility] and ablation    Past Surgical History:  Procedure Laterality Date  . CESAREAN SECTION     x3  . DILITATION & CURRETTAGE/HYSTROSCOPY WITH NOVASURE ABLATION N/A 10/24/2014   Procedure: HYSTEROSCOPY WITH NOVASURE ENDOMETRIAL ABLATION;  Surgeon: Florian Buff, MD;  Location: [facility];  Service: Gynecology;  Laterality: N/A;  Uterine Cavity Length 5.5cm Uterine Cavity Width 4.5cm power = 136 watts time = 55 seconds  . KNEE SURGERY Left    removal of bone-from being bow-legged- done X2.  . TUBAL LIGATION    . WISDOM TOOTH EXTRACTION      Family History  Problem Relation Age of Onset  .  Diabetes Mother   . Hypertension Father   . Other Father        has had open heart surgery; has one kidney  . Hypertension Sister   . Diabetes Brother   . Anxiety disorder Daughter   . Bipolar disorder Son   . Schizophrenia Son   . Diabetes Maternal Grandmother   . Kidney disease Maternal Grandfather   . Cancer Paternal Grandmother        breast  . Cancer Sister        breast  . Diabetes Brother        borderline  . Seizures Son   . Colon cancer Neg Hx     Social History   Socioeconomic History  . Marital status: Single    Spouse name: Not on file  . Number of children: Not on file  . Years of education: Not on file  . Highest education level: Not on file  Occupational History  . Not on file  Tobacco Use  . Smoking status: Former Smoker    Packs/day: 0.25    Years: 10.00    Pack years: 2.50    Types: Cigarettes    Quit date: 10/22/2007    Years since quitting: 11.9  . Smokeless tobacco: Never Used  Substance and Sexual Activity  . Alcohol use: Not Currently  Comment: occ  . Drug use: No  . Sexual activity: Yes    Birth control/protection: None, Surgical    Comment: tubal  Other Topics Concern  . Not on file  Social History Narrative  . Not on file   Social Determinants of Health   Financial Resource Strain:   . Difficulty of Paying Living Expenses: Not on file  Food Insecurity:   . Worried About Charity fundraiser in the Last Year: Not on file  . Ran Out of Food in the Last Year: Not on file  Transportation Needs:   . Lack of Transportation (Medical): Not on file  . Lack of Transportation (Non-Medical): Not on file  Physical Activity:   . Days of Exercise per Week: Not on file  . Minutes of Exercise per Session: Not on file  Stress:   . Feeling of Stress : Not on file  Social Connections:   . Frequency of Communication with Friends and Family: Not on file  . Frequency of Social Gatherings with Friends and Family: Not on file  . Attends Religious  Services: Not on file  . Active Member of Clubs or Organizations: Not on file  . Attends Archivist Meetings: Not on file  . Marital Status: Not on file  Intimate Partner Violence:   . Fear of Current or Ex-Partner: Not on file  . Emotionally Abused: Not on file  . Physically Abused: Not on file  . Sexually Abused: Not on file      ROS:  General: Negative for anorexia, weight loss, fever, chills, fatigue, weakness. Eyes: Negative for vision changes.  ENT: Negative for hoarseness, difficulty swallowing , nasal congestion. CV: Negative for chest pain, angina, palpitations, dyspnea on exertion, peripheral edema.  Respiratory: Negative for dyspnea at rest, dyspnea on exertion, cough, sputum, wheezing.  GI: See history of present illness. GU:  Negative for dysuria, hematuria, urinary incontinence, urinary frequency, nocturnal urination.  MS: Negative low back pain.  Chronic knee pain. Derm: Negative for rash or itching.  Neuro: Negative for weakness, abnormal sensation, seizure, frequent headaches, memory loss, confusion.  Psych: Negative for anxiety, depression, suicidal ideation, hallucinations.  Endo: Negative for unusual weight change.  Heme: Negative for bruising or bleeding. Allergy: Negative for rash or hives.    Physical Examination:  BP 120/69   Pulse 91   Temp 99.3 F (37.4 C)   Ht 5' (1.524 m)   Wt 234 lb 3.2 oz (106.2 kg)   BMI 45.74 kg/m    General: Well-nourished, well-developed in no acute distress.  Head: Normocephalic, atraumatic.   Eyes: Conjunctiva pink, no icterus. Mouth: Masked. Neck: Supple without thyromegaly, masses, or lymphadenopathy.  Lungs: Clear to auscultation bilaterally.  Heart: Regular rate and rhythm, no murmurs rubs or gallops.  Abdomen: Bowel sounds are normal, nontender, nondistended, no hepatosplenomegaly or masses, no abdominal bruits or    hernia , no rebound or guarding.   Rectal: not performed Extremities: No lower  extremity edema. No clubbing or deformities.  Neuro: Alert and oriented x 4 , grossly normal neurologically.  Skin: Warm and dry, no rash or jaundice.   Psych: Alert and cooperative, normal mood and affect.   Imaging Studies: DG Knee AP/LAT W/[place] Left  Result Date: 09/13/2019 X-rays left knee Left knee pain history of prior surgery as a child X-ray shows a deformity of the knee joint with subluxation of the tibia laterally distal deformity of the femur and proximal deformity of the tibia Joint spaces are  narrowed but still open bilaterally there is an osteophyte on the lateral femur on the AP view as well Axial view shows decreased joint space in the lateral patellofemoral joint with normal tracking of the patella osteophytes on the femur medially and laterally Impression proximal tibia distal femur ORIF deformity with degenerative changes

## 2019-09-15 NOTE — Progress Notes (Signed)
CC'ED TO PCP 

## 2019-10-04 ENCOUNTER — Telehealth: Payer: Self-pay | Admitting: *Deleted

## 2019-10-04 ENCOUNTER — Other Ambulatory Visit: Payer: Self-pay | Admitting: *Deleted

## 2019-10-04 MED ORDER — NA SULFATE-K SULFATE-MG SULF 17.5-3.13-1.6 GM/177ML PO SOLN: 1.0000 | Freq: Once | ORAL | 0 refills | Status: AC

## 2019-10-04 NOTE — Telephone Encounter (Signed)
Spoke with pt and scheduled her procedure for 10/09/2019.  Pt is aware that I will fax her prep instructions, Covid screening and pre-op information to [facility].  Pt voiced understanding.

## 2019-10-05 ENCOUNTER — Encounter: Payer: Self-pay | Admitting: *Deleted

## 2019-10-05 ENCOUNTER — Telehealth: Payer: Self-pay | Admitting: *Deleted

## 2019-10-05 NOTE — Patient Instructions (Signed)
Your procedure is scheduled on:10/09/2019   Report to [facility] at     12:00 PM.  Call this number if you have problems the morning of surgery: [phone]   Remember:              Follow Directions on the letter you received from Your Physician's office regarding the Bowel Prep              No Smoking the day of Procedure :   Take these medicines the morning of surgery with A SIP OF WATER: none   Do not wear jewelry, make-up or nail polish.    Do not bring valuables to the hospital.  Contacts, dentures or bridgework may not be worn into surgery.  .   Patients discharged the day of surgery will not be allowed to drive home.     Colonoscopy, Adult, Care After This sheet gives you information about how to care for yourself after your procedure. Your health care provider may also give you more specific instructions. If you have problems or questions, contact your health care provider. What can I expect after the procedure? After the procedure, it is common to have:  A small amount of blood in your stool for 24 hours after the procedure.  Some gas.  Mild abdominal cramping or bloating.  Follow these instructions at home: General instructions   For the first 24 hours after the procedure: ? Do not drive or use machinery. ? Do not sign important documents. ? Do not drink alcohol. ? Do your regular daily activities at a slower pace than normal. ? Eat soft, easy-to-digest foods. ? Rest often.  Take over-the-counter or prescription medicines only as told by your health care provider.  It is up to you to get the results of your procedure. Ask your health care provider, or the department performing the procedure, when your results will be ready. Relieving cramping and bloating  Try walking around when you have cramps or feel bloated.  Apply heat to your abdomen as told by your health care provider. Use a heat source that your health care provider recommends, such as a moist  heat pack or a heating pad. ? Place a towel between your skin and the heat source. ? Leave the heat on for 20-30 minutes. ? Remove the heat if your skin turns bright red. This is especially important if you are unable to feel pain, heat, or cold. You may have a greater risk of getting burned. Eating and drinking  Drink enough fluid to keep your urine clear or pale yellow.  Resume your normal diet as instructed by your health care provider. Avoid heavy or fried foods that are hard to digest.  Avoid drinking alcohol for as long as instructed by your health care provider. Contact a health care provider if:  You have blood in your stool 2-3 days after the procedure. Get help right away if:  You have more than a small spotting of blood in your stool.  You pass large blood clots in your stool.  Your abdomen is swollen.  You have nausea or vomiting.  You have a fever.  You have increasing abdominal pain that is not relieved with medicine. This information is not intended to replace advice given to you by your health care provider. Make sure you discuss any questions you have with your health care provider. Document Released: 04/07/2004 Document Revised: 05/18/2016 Document Reviewed: 11/05/2015 Elsevier Interactive Patient Education  [employer].

## 2019-10-05 NOTE — Telephone Encounter (Signed)
Spoke to pt and she is aware that Day Surgery has her scheduled for a Pre-op appt at 2:15 and Covid screening at 3:00 tomorrow.  Pt also made aware that her prep kit and instructions have been sent to [facility].  Pt voiced understanding.

## 2019-10-06 ENCOUNTER — Encounter (HOSPITAL_COMMUNITY)
Admission: RE | Admit: 2019-10-06 | Discharge: 2019-10-06 | Disposition: A | Discharge status: Home / Self Care | Payer: BC Managed Care – PPO | Source: Ambulatory Visit | Attending: Internal Medicine | Admitting: Internal Medicine

## 2019-10-06 ENCOUNTER — Telehealth: Payer: Self-pay

## 2019-10-06 ENCOUNTER — Encounter (HOSPITAL_COMMUNITY): Payer: Self-pay | Admitting: Anesthesiology

## 2019-10-06 ENCOUNTER — Other Ambulatory Visit (HOSPITAL_COMMUNITY)
Admission: RE | Admit: 2019-10-06 | Discharge: 2019-10-06 | Disposition: A | Discharge status: Home / Self Care | Payer: BC Managed Care – PPO | Source: Ambulatory Visit | Attending: Internal Medicine | Admitting: Internal Medicine

## 2019-10-06 ENCOUNTER — Other Ambulatory Visit: Payer: Self-pay

## 2019-10-06 DIAGNOSIS — Z01812 Encounter for preprocedural laboratory examination: Secondary | ICD-10-CM | POA: Diagnosis present

## 2019-10-06 DIAGNOSIS — U071 COVID-19: Secondary | ICD-10-CM | POA: Diagnosis not present

## 2019-10-06 LAB — HCG, SERUM, QUALITATIVE: Preg, Serum: NEGATIVE

## 2019-10-06 NOTE — Telephone Encounter (Signed)
Called pt, TCS 10/09/19 moved up to 8:30am. Advised her to start drinking 2nd half of prep morning of procedure at 3:30am. NPO after 5:30am. Endo scheduler informed.

## 2019-10-07 LAB — SARS CORONAVIRUS 2 (TAT 6-24 HRS): SARS Coronavirus 2: POSITIVE — AB

## 2019-10-08 ENCOUNTER — Telehealth (HOSPITAL_COMMUNITY): Payer: Self-pay | Admitting: Physician Assistant

## 2019-10-08 NOTE — OR Nursing (Signed)
Called Jodi Chaney about her COVID test being positive.  Told her to contact her primary MD.  Make sure she tells the persons that have come in contact with her.  She questioned about when to do her procedure.  Told her to contact Dr. Roseanne Kaufman office after talking with her primary MD.

## 2019-10-08 NOTE — Telephone Encounter (Signed)
Called to discuss with Alferd Apa about Covid symptoms and the use of bamlanivimab, a monoclonal antibody infusion for those with mild to moderate Covid symptoms and at a high risk of hospitalization.   tion worsen. Preventative practices reviewed. Patient verbalized understanding.  Pt does not qualify for infusion therapy as her symptoms first presented > 10 days prior to timing of infusion. Symptoms tier reviewed as well as criteria for ending isolation. Preventative practices reviewed. Patient verbalized understanding  Patient Active Problem List   Diagnosis Date Noted  . Obesity 09/15/2019  . Severe obesity (BMI >= 40) ([place]) 09/15/2019  . Encounter for gynecological examination with Papanicolaou smear of cervix 10/05/2018  . Screening for colorectal cancer 10/05/2018  . Positive fecal occult blood test 10/05/2018  . Thickened endometrium 10/04/2014  . Fibroids 10/04/2014  . Endometrial polyp 10/04/2014  . Anemia 08/24/2014  . Menorrhagia 08/24/2014     Leanor Kail, PA - C

## 2019-10-09 ENCOUNTER — Other Ambulatory Visit: Payer: Self-pay

## 2019-10-09 ENCOUNTER — Telehealth: Payer: Self-pay

## 2019-10-09 ENCOUNTER — Ambulatory Visit (HOSPITAL_COMMUNITY)
Admission: RE | Admit: 2019-10-09 | Payer: BC Managed Care – PPO | Source: Home / Self Care | Admitting: Internal Medicine

## 2019-10-09 ENCOUNTER — Encounter (HOSPITAL_COMMUNITY): Admission: RE | Payer: Self-pay | Source: Home / Self Care

## 2019-10-09 SURGERY — COLONOSCOPY WITH PROPOFOL
Anesthesia: Monitor Anesthesia Care

## 2019-10-09 MED ORDER — SUPREP BOWEL PREP KIT 17.5-3.13-1.6 GM/177ML PO SOLN: 1.0000 | ORAL | 0 refills | Status: DC

## 2019-10-09 NOTE — Telephone Encounter (Signed)
Jodi Chaney at [facility] called office, pt was positive for COVID. TCS for today cancelled. Pt was informed yesterday.  Tried to call pt to reschedule TCS w/Prop w/RMR, no answer, LMOVM for return call.

## 2019-10-09 NOTE — Telephone Encounter (Signed)
Noted  

## 2019-10-09 NOTE — Telephone Encounter (Signed)
Pre-op appt 12/05/19. Letter mailed with new procedure instructions.

## 2019-10-09 NOTE — Telephone Encounter (Signed)
Pt called office, TCS rescheduled to 12/07/19 at 12:15pm. Orders re-entered. Will not need COVID test d/t testing positive 10/06/19. Prep rx sent to pharmacy.

## 2019-12-04 NOTE — Patient Instructions (Signed)
Jodi Chaney  12/04/2019     @PREFPERIOPPHARMACY @   Your procedure is scheduled on  12/07/2019 .  Report to [facility] at  [place]  A.M.  Call this number if you have problems the morning of surgery:  [phone]   Remember:  Follow the diet and prep instructions given to you by Dr Roseanne Kaufman office.                     Take these medicines the morning of surgery with A SIP OF WATER  Mobic(if needed).    Do not wear jewelry, make-up or nail polish.  Do not wear lotions, powders, or perfumes. Please wear deoorant and brush your teeth.  Do not shave 48 hours prior to surgery.  Men may shave face and neck.  Do not bring valuables to the hospital.  [facility] is not responsible for any belongings or valuables.  Contacts, dentures or bridgework may not be worn into surgery.  Leave your suitcase in the car.  After surgery it may be brought to your room.  For patients admitted to the hospital, discharge time will be determined by your treatment team.  Patients discharged the day of surgery will not be allowed to drive home.   Name and phone number of your driver:   family Special instructions:  DO NOT smoke the morning of your procedure.  Please read over the following fact sheets that you were given. Anesthesia Post-op Instructions and Care and Recovery After Surgery       Colonoscopy, Adult, Care After This sheet gives you information about how to care for yourself after your procedure. Your health care provider may also give you more specific instructions. If you have problems or questions, contact your health care provider. What can I expect after the procedure? After the procedure, it is common to have:  A small amount of blood in your stool for 24 hours after the procedure.  Some gas.  Mild cramping or bloating of your abdomen. Follow these instructions at home: Eating and drinking   Drink enough fluid to keep your urine pale yellow.  Follow  instructions from your health care provider about eating or drinking restrictions.  Resume your normal diet as instructed by your health care provider. Avoid heavy or fried foods that are hard to digest. Activity  Rest as told by your health care provider.  Avoid sitting for a long time without moving. Get up to take short walks every 1-2 hours. This is important to improve blood flow and breathing. Ask for help if you feel weak or unsteady.  Return to your normal activities as told by your health care provider. Ask your health care provider what activities are safe for you. Managing cramping and bloating   Try walking around when you have cramps or feel bloated.  Apply heat to your abdomen as told by your health care provider. Use the heat source that your health care provider recommends, such as a moist heat pack or a heating pad. ? Place a towel between your skin and the heat source. ? Leave the heat on for 20-30 minutes. ? Remove the heat if your skin turns bright red. This is especially important if you are unable to feel pain, heat, or cold. You may have a greater risk of getting burned. General instructions  For the first 24 hours after the procedure: ? Do not drive or use machinery. ? Do not sign  important documents. ? Do not drink alcohol. ? Do your regular daily activities at a slower pace than normal. ? Eat soft foods that are easy to digest.  Take over-the-counter and prescription medicines only as told by your health care provider.  Keep all follow-up visits as told by your health care provider. This is important. Contact a health care provider if:  You have blood in your stool 2-3 days after the procedure. Get help right away if you have:  More than a small spotting of blood in your stool.  Large blood clots in your stool.  Swelling of your abdomen.  Nausea or vomiting.  A fever.  Increasing pain in your abdomen that is not relieved with medicine. Summary   After the procedure, it is common to have a small amount of blood in your stool. You may also have mild cramping and bloating of your abdomen.  For the first 24 hours after the procedure, do not drive or use machinery, sign important documents, or drink alcohol.  Get help right away if you have a lot of blood in your stool, nausea or vomiting, a fever, or increased pain in your abdomen. This information is not intended to replace advice given to you by your health care provider. Make sure you discuss any questions you have with your health care provider. Document Revised: 03/20/2019 Document Reviewed: 03/20/2019 Elsevier Patient Education  [place] After These instructions provide you with information about caring for yourself after your procedure. Your health care provider may also give you more specific instructions. Your treatment has been planned according to current medical practices, but problems sometimes occur. Call your health care provider if you have any problems or questions after your procedure. What can I expect after the procedure? After your procedure, you may:  Feel sleepy for several hours.  Feel clumsy and have poor balance for several hours.  Feel forgetful about what happened after the procedure.  Have poor judgment for several hours.  Feel nauseous or vomit.  Have a sore throat if you had a breathing tube during the procedure. Follow these instructions at home: For at least 24 hours after the procedure:      Have a responsible adult stay with you. It is important to have someone help care for you until you are awake and alert.  Rest as needed.  Do not: ? Participate in activities in which you could fall or become injured. ? Drive. ? Use heavy machinery. ? Drink alcohol. ? Take sleeping pills or medicines that cause drowsiness. ? Make important decisions or sign legal documents. ? Take care of children on your  own. Eating and drinking  Follow the diet that is recommended by your health care provider.  If you vomit, drink water, juice, or soup when you can drink without vomiting.  Make sure you have little or no nausea before eating solid foods. General instructions  Take over-the-counter and prescription medicines only as told by your health care provider.  If you have sleep apnea, surgery and certain medicines can increase your risk for breathing problems. Follow instructions from your health care provider about wearing your sleep device: ? Anytime you are sleeping, including during daytime naps. ? While taking prescription pain medicines, sleeping medicines, or medicines that make you drowsy.  If you smoke, do not smoke without supervision.  Keep all follow-up visits as told by your health care provider. This is important. Contact a health care provider if:  You keep feeling nauseous or you keep vomiting.  You feel light-headed.  You develop a rash.  You have a fever. Get help right away if:  You have trouble breathing. Summary  For several hours after your procedure, you may feel sleepy and have poor judgment.  Have a responsible adult stay with you for at least 24 hours or until you are awake and alert. This information is not intended to replace advice given to you by your health care provider. Make sure you discuss any questions you have with your health care provider. Document Revised: 11/22/2017 Document Reviewed: 12/15/2015 Elsevier Patient Education  [place].

## 2019-12-05 ENCOUNTER — Other Ambulatory Visit: Payer: Self-pay

## 2019-12-05 ENCOUNTER — Encounter (HOSPITAL_COMMUNITY)
Admission: RE | Admit: 2019-12-05 | Discharge: 2019-12-05 | Disposition: A | Discharge status: Home / Self Care | Payer: BC Managed Care – PPO | Source: Ambulatory Visit | Attending: Internal Medicine | Admitting: Internal Medicine

## 2019-12-05 ENCOUNTER — Other Ambulatory Visit: Payer: Self-pay | Admitting: Internal Medicine

## 2019-12-05 ENCOUNTER — Encounter (HOSPITAL_COMMUNITY): Payer: Self-pay

## 2019-12-05 DIAGNOSIS — Z01812 Encounter for preprocedural laboratory examination: Secondary | ICD-10-CM | POA: Insufficient documentation

## 2019-12-05 LAB — BASIC METABOLIC PANEL
Anion gap: 6 (ref 5–15)
BUN: 11 mg/dL (ref 6–20)
CO2: 24 mmol/L (ref 22–32)
Calcium: 8.9 mg/dL (ref 8.9–10.3)
Chloride: 108 mmol/L (ref 98–111)
Creatinine, Ser: 0.63 mg/dL (ref 0.44–1.00)
GFR calc Af Amer: >60 mL/min (ref >60)
GFR calc non Af Amer: >60 mL/min (ref >60)
Glucose, Bld: 110 mg/dL — ABNORMAL HIGH (ref 70–99)
Potassium: 4 mmol/L (ref 3.5–5.1)
Sodium: 138 mmol/L (ref 135–145)

## 2019-12-05 LAB — CBC WITH DIFFERENTIAL/PLATELET
Abs Immature Granulocytes: 0.01 10*3/uL (ref 0.00–0.07)
Basophils Absolute: 0 10*3/uL (ref 0.0–0.1)
Basophils Relative: 0 %
Eosinophils Absolute: 0.2 10*3/uL (ref 0.0–0.5)
Eosinophils Relative: 4 %
HCT: 31.8 % — ABNORMAL LOW (ref 36.0–46.0)
Hemoglobin: 9.7 g/dL — ABNORMAL LOW (ref 12.0–15.0)
Immature Granulocytes: 0 %
Lymphocytes Relative: 26 %
Lymphs Abs: 1.2 10*3/uL (ref 0.7–4.0)
MCH: 24.6 pg — ABNORMAL LOW (ref 26.0–34.0)
MCHC: 30.5 g/dL (ref 30.0–36.0)
MCV: 80.7 fL (ref 80.0–100.0)
Monocytes Absolute: 0.6 10*3/uL (ref 0.1–1.0)
Monocytes Relative: 13 %
Neutro Abs: 2.7 10*3/uL (ref 1.7–7.7)
Neutrophils Relative %: 57 %
Platelets: 301 10*3/uL (ref 150–400)
RBC: 3.94 MIL/uL (ref 3.87–5.11)
RDW: 17.4 % — ABNORMAL HIGH (ref 11.5–15.5)
WBC: 4.7 10*3/uL (ref 4.0–10.5)
nRBC: 0 % (ref 0.0–0.2)

## 2019-12-05 LAB — HCG, SERUM, QUALITATIVE: Preg, Serum: NEGATIVE

## 2019-12-06 ENCOUNTER — Telehealth: Payer: Self-pay | Admitting: Internal Medicine

## 2019-12-06 MED ORDER — NA SULFATE-K SULFATE-MG SULF 17.5-3.13-1.6 GM/177ML PO SOLN: 1.0000 | Freq: Once | ORAL | 0 refills | Status: AC

## 2019-12-06 NOTE — Pre-Procedure Instructions (Signed)
Dr Charna Elizabeth aware of H&H. No orders given.

## 2019-12-06 NOTE — Pre-Procedure Instructions (Signed)
H&H routed to Dr Rourk. 

## 2019-12-06 NOTE — Telephone Encounter (Signed)
Pt is scheduled procedure for in the morning. She said she needs her prep. I told her the Suprep Rx was sent to [facility] on 10/09/2019 and have a confirmed receipt that they received it. She is saying that before she had already taken one bottle before her procedure was cancelled and she needs a new prescription sent in so she can start her prep today. [phone]

## 2019-12-06 NOTE — Telephone Encounter (Signed)
New rx for suprep has been sent in.  Called pt and made aware

## 2019-12-06 NOTE — Telephone Encounter (Signed)
See other phone note

## 2019-12-06 NOTE — Telephone Encounter (Signed)
Pt is scheduled procedure for in the morning. She said she needs her prep. I told her the Suprep Rx was sent to [facility] on 10/09/2019 and have a confirmed receipt that they received it. She is saying that before she had already taken one bottle before her procedure was cancelled and she needs a new prescription sent in so she can start her prep today. [phone]

## 2019-12-07 ENCOUNTER — Ambulatory Visit (HOSPITAL_COMMUNITY): Payer: BC Managed Care – PPO | Admitting: Anesthesiology

## 2019-12-07 ENCOUNTER — Ambulatory Visit (HOSPITAL_COMMUNITY)
Admission: RE | Admit: 2019-12-07 | Discharge: 2019-12-07 | Disposition: A | Discharge status: Home / Self Care | Payer: BC Managed Care – PPO | Attending: Internal Medicine | Admitting: Internal Medicine

## 2019-12-07 ENCOUNTER — Encounter (HOSPITAL_COMMUNITY): Payer: Self-pay | Admitting: Internal Medicine

## 2019-12-07 ENCOUNTER — Other Ambulatory Visit: Payer: Self-pay

## 2019-12-07 ENCOUNTER — Encounter (HOSPITAL_COMMUNITY)
Admission: RE | Disposition: A | Discharge status: Home / Self Care | Payer: Self-pay | Source: Home / Self Care | Attending: Internal Medicine

## 2019-12-07 DIAGNOSIS — Z79899 Other long term (current) drug therapy: Secondary | ICD-10-CM | POA: Diagnosis not present

## 2019-12-07 DIAGNOSIS — Z87891 Personal history of nicotine dependence: Secondary | ICD-10-CM | POA: Insufficient documentation

## 2019-12-07 DIAGNOSIS — Z791 Long term (current) use of non-steroidal anti-inflammatories (NSAID): Secondary | ICD-10-CM | POA: Insufficient documentation

## 2019-12-07 DIAGNOSIS — Z6841 Body Mass Index (BMI) 40.0 and over, adult: Secondary | ICD-10-CM | POA: Diagnosis not present

## 2019-12-07 DIAGNOSIS — K635 Polyp of colon: Secondary | ICD-10-CM | POA: Insufficient documentation

## 2019-12-07 DIAGNOSIS — Z1211 Encounter for screening for malignant neoplasm of colon: Secondary | ICD-10-CM

## 2019-12-07 HISTORY — PX: POLYPECTOMY: SHX5525

## 2019-12-07 HISTORY — PX: COLONOSCOPY WITH PROPOFOL: SHX5780

## 2019-12-07 SURGERY — COLONOSCOPY WITH PROPOFOL
Anesthesia: General

## 2019-12-07 MED ORDER — LACTATED RINGERS IV SOLN: Freq: Once | INTRAVENOUS | Status: AC

## 2019-12-07 MED ORDER — GLYCOPYRROLATE PF 0.2 MG/ML IJ SOSY
PREFILLED_SYRINGE | INTRAMUSCULAR | Status: AC
  Filled 2019-12-07: qty 1

## 2019-12-07 MED ORDER — PROPOFOL 10 MG/ML IV BOLUS
INTRAVENOUS | Status: AC
  Filled 2019-12-07: qty 20

## 2019-12-07 MED ORDER — EPHEDRINE SULFATE 50 MG/ML IJ SOLN
INTRAMUSCULAR | Status: DC | PRN
  Administered 2019-12-07: 5 mg via INTRAVENOUS

## 2019-12-07 MED ORDER — GLYCOPYRROLATE 0.2 MG/ML IJ SOLN
INTRAMUSCULAR | Status: DC | PRN
  Administered 2019-12-07: .1 mg via INTRAVENOUS

## 2019-12-07 MED ORDER — LIDOCAINE HCL (CARDIAC) PF 100 MG/5ML IV SOSY
PREFILLED_SYRINGE | INTRAVENOUS | Status: DC | PRN
  Administered 2019-12-07: 50 mg via INTRAVENOUS

## 2019-12-07 MED ORDER — PROPOFOL 10 MG/ML IV BOLUS
INTRAVENOUS | Status: DC | PRN
  Administered 2019-12-07: 20 mg via INTRAVENOUS

## 2019-12-07 MED ORDER — CHLORHEXIDINE GLUCONATE CLOTH 2 % EX PADS: 6.0000 | MEDICATED_PAD | Freq: Once | CUTANEOUS | Status: DC

## 2019-12-07 MED ORDER — EPHEDRINE 5 MG/ML INJ
INTRAVENOUS | Status: AC
  Filled 2019-12-07: qty 10

## 2019-12-07 MED ORDER — PROPOFOL 500 MG/50ML IV EMUL
INTRAVENOUS | Status: DC | PRN
  Administered 2019-12-07: 125 ug/kg/min via INTRAVENOUS

## 2019-12-07 MED ORDER — LIDOCAINE 2% (20 MG/ML) 5 ML SYRINGE
INTRAMUSCULAR | Status: AC
  Filled 2019-12-07: qty 5

## 2019-12-07 MED ORDER — KETAMINE HCL 10 MG/ML IJ SOLN
INTRAMUSCULAR | Status: DC | PRN
  Administered 2019-12-07: 20 mg via INTRAVENOUS
  Administered 2019-12-07: 30 mg via INTRAVENOUS

## 2019-12-07 MED ORDER — KETAMINE HCL 50 MG/5ML IJ SOSY
PREFILLED_SYRINGE | INTRAMUSCULAR | Status: AC
  Filled 2019-12-07: qty 5

## 2019-12-07 NOTE — Transfer of Care (Signed)
Immediate Anesthesia Transfer of Care Note  Patient: Jodi Chaney  Procedure(s) Performed: COLONOSCOPY WITH PROPOFOL (N/A ) POLYPECTOMY  Patient Location: PACU  Anesthesia Type:General  Level of Consciousness: awake  Airway & Oxygen Therapy: Patient Spontanous Breathing  Post-op Assessment: Report given to RN and Post -op Vital signs reviewed and stable  Post vital signs: Reviewed and stable  Last Vitals:  Vitals Value Taken Time  BP    Temp    Pulse    Resp    SpO2      Last Pain:  Vitals:   12/07/19 1005  TempSrc: Oral  PainSc: 0-No pain         Complications: No apparent anesthesia complications

## 2019-12-07 NOTE — H&P (Signed)
@LOGO @   Primary Care Physician:  Celene Squibb, MD Primary Gastroenterologist:  Dr. Gala Romney  Pre-Procedure History & Physical: HPI:  Jodi Chaney is a 49 y.o. female is here for a screening colonoscopy.  First ever  average risk examination.  Past Medical History:  Diagnosis Date  . Anemia   . Endometrial polyp 10/04/2014  . Fibroids 10/04/2014  . Menorrhagia 08/24/2014  . Thickened endometrium 10/04/2014   ?polyp to see Dr Elonda Husky about [facility] and ablation    Past Surgical History:  Procedure Laterality Date  . CESAREAN SECTION     x3  . DILITATION & CURRETTAGE/HYSTROSCOPY WITH NOVASURE ABLATION N/A 10/24/2014   Procedure: HYSTEROSCOPY WITH NOVASURE ENDOMETRIAL ABLATION;  Surgeon: Florian Buff, MD;  Location: [facility];  Service: Gynecology;  Laterality: N/A;  Uterine Cavity Length 5.5cm Uterine Cavity Width 4.5cm power = 136 watts time = 55 seconds  . KNEE SURGERY Left    removal of bone-from being bow-legged- done X2.  . TUBAL LIGATION    . WISDOM TOOTH EXTRACTION      Prior to Admission medications   Medication Sig Start Date End Date Taking? Authorizing Provider  cholecalciferol (VITAMIN D3) 25 MCG (1000 UNIT) tablet Take 1,000 Units by mouth daily.   Yes [provider]  ferrous sulfate 325 (65 FE) MG tablet Take 325 mg by mouth daily with breakfast.   Yes [provider]  furosemide (LASIX) 20 MG tablet Take 20 mg by mouth daily. 11/11/19  Yes [provider]  meloxicam (MOBIC) 7.5 MG tablet Take 1 tablet (7.5 mg total) by mouth 2 (two) times daily. 09/13/19  Yes Carole Civil, MD  Na Sulfate-K Sulfate-Mg Sulf (SUPREP BOWEL PREP KIT) 17.5-3.13-1.6 GM/177ML SOLN Take 1 kit by mouth as directed. 10/09/19  Yes Daneil Dolin, MD    Allergies as of 10/09/2019  . (No Known Allergies)    Family History  Problem Relation Age of Onset  . Diabetes Mother   . Hypertension Father   . Other Father        has had open heart surgery; has one kidney   . Hypertension Sister   . Diabetes Brother   . Anxiety disorder Daughter   . Bipolar disorder Son   . Schizophrenia Son   . Diabetes Maternal Grandmother   . Kidney disease Maternal Grandfather   . Cancer Paternal Grandmother        breast  . Cancer Sister        breast  . Diabetes Brother        borderline  . Seizures Son   . Colon cancer Neg Hx     Social History   Socioeconomic History  . Marital status: Single    Spouse name: Not on file  . Number of children: Not on file  . Years of education: Not on file  . Highest education level: Not on file  Occupational History  . Not on file  Tobacco Use  . Smoking status: Former Smoker    Packs/day: 0.25    Years: 10.00    Pack years: 2.50    Types: Cigarettes    Quit date: 10/22/2007    Years since quitting: 12.1  . Smokeless tobacco: Never Used  Substance and Sexual Activity  . Alcohol use: Not Currently    Comment: occ  . Drug use: No  . Sexual activity: Yes    Birth control/protection: None, Surgical    Comment: tubal  Other Topics Concern  .  Not on file  Social History Narrative  . Not on file   Social Determinants of Health   Financial Resource Strain:   . Difficulty of Paying Living Expenses:   Food Insecurity:   . Worried About Charity fundraiser in the Last Year:   . Arboriculturist in the Last Year:   Transportation Needs:   . Film/video editor (Medical):   Marland Kitchen Lack of Transportation (Non-Medical):   Physical Activity:   . Days of Exercise per Week:   . Minutes of Exercise per Session:   Stress:   . Feeling of Stress :   Social Connections:   . Frequency of Communication with Friends and Family:   . Frequency of Social Gatherings with Friends and Family:   . Attends Religious Services:   . Active Member of Clubs or Organizations:   . Attends Archivist Meetings:   Marland Kitchen Marital Status:   Intimate Partner Violence:   . Fear of Current or Ex-Partner:   . Emotionally Abused:   Marland Kitchen  Physically Abused:   . Sexually Abused:     Review of Systems: See HPI, otherwise negative ROS  Physical Exam: BP (!) 101/43   Pulse 83   Temp 98.3 F (36.8 C) (Oral)   Resp 15   Ht 5' (1.524 m)   Wt 105.7 kg   SpO2 99%   BMI 45.50 kg/m  General:   Alert,  Well-developed, well-nourished, pleasant and cooperative in NAD Head:  Normocephalic and atraumatic. Lungs:  Clear throughout to auscultation.   No wheezes, crackles, or rhonchi. No acute distress. Heart:  Regular rate and rhythm; no murmurs, clicks, rubs,  or gallops. Abdomen:  Soft, nontender and nondistended. No masses, hepatosplenomegaly or hernias noted. Normal bowel sounds, without guarding, and without rebound.    Impression/Plan: Jodi Chaney is now here to undergo a screening colonoscopy.  First ever average rescreening examination.  Risks, benefits, limitations, imponderables and alternatives regarding colonoscopy have been reviewed with the patient. Questions have been answered. All parties agreeable.     Notice:  This dictation was prepared with Dragon dictation along with smaller phrase technology. Any transcriptional errors that result from this process are unintentional and may not be corrected upon review.

## 2019-12-07 NOTE — Anesthesia Postprocedure Evaluation (Signed)
Anesthesia Post Note  Patient: Jodi Chaney  Procedure(s) Performed: COLONOSCOPY WITH PROPOFOL (N/A ) POLYPECTOMY  Patient location during evaluation: Phase II Anesthesia Type: General Level of consciousness: awake and alert Pain management: pain level controlled Vital Signs Assessment: post-procedure vital signs reviewed and stable Respiratory status: spontaneous breathing Cardiovascular status: stable Postop Assessment: no apparent nausea or vomiting Anesthetic complications: no     Last Vitals:  Vitals:   12/07/19 1005 12/07/19 1149  BP: (!) 101/43 (!) 117/45  Pulse: 83 77  Resp: 15 18  Temp: 36.8 C 36.9 C  SpO2: 99% 100%    Last Pain:  Vitals:   12/07/19 1149  TempSrc:   PainSc: 0-No pain                 Jodi Chaney

## 2019-12-07 NOTE — Anesthesia Preprocedure Evaluation (Signed)
Anesthesia Evaluation  Patient identified by MRN, date of birth, ID band Patient awake    Reviewed: Allergy & Precautions, NPO status , Patient's Chart, lab work & pertinent test results  Airway Mallampati: II  TM Distance: >3 FB Neck ROM: Full    Dental  (+) Missing, Dental Advisory Given   Pulmonary former smoker,    Pulmonary exam normal breath sounds clear to auscultation       Cardiovascular Normal cardiovascular exam Rhythm:Regular Rate:Normal     Neuro/Psych negative neurological ROS  negative psych ROS   GI/Hepatic negative GI ROS, Neg liver ROS,   Endo/Other  Morbid obesity  Renal/GU negative Renal ROS     Musculoskeletal negative musculoskeletal ROS (+)   Abdominal   Peds  Hematology negative hematology ROS (+) anemia ,   Anesthesia Other Findings   Reproductive/Obstetrics negative OB ROS                             Anesthesia Physical Anesthesia Plan  ASA: III  Anesthesia Plan: General   Post-op Pain Management:    Induction: Intravenous  PONV Risk Score and Plan: 2 and Treatment may vary due to age or medical condition  Airway Management Planned: Nasal Cannula, Natural Airway and Simple Face Mask  Additional Equipment:   Intra-op Plan:   Post-operative Plan:   Informed Consent: I have reviewed the patients History and Physical, chart, labs and discussed the procedure including the risks, benefits and alternatives for the proposed anesthesia with the patient or authorized representative who has indicated his/her understanding and acceptance.     Dental advisory given  Plan Discussed with: CRNA and Surgeon  Anesthesia Plan Comments:         Anesthesia Quick Evaluation

## 2019-12-07 NOTE — Discharge Instructions (Signed)
Colon Polyps  Polyps are tissue growths inside the body. Polyps can grow in many places, including the large intestine (colon). A polyp may be a round bump or a mushroom-shaped growth. You could have one polyp or several. Most colon polyps are noncancerous (benign). However, some colon polyps can become cancerous over time. Finding and removing the polyps early can help prevent this. What are the causes? The exact cause of colon polyps is not known. What increases the risk? You are more likely to develop this condition if you: Have a family history of colon cancer or colon polyps. Are older than 63 or older than 45 if you are African American. Have inflammatory bowel disease, such as ulcerative colitis or Crohn's disease. Have certain hereditary conditions, such as: Familial adenomatous polyposis. Lynch syndrome. Turcot syndrome. Peutz-Jeghers syndrome. Are overweight. Smoke cigarettes. Do not get enough exercise. Drink too much alcohol. Eat a diet that is high in fat and red meat and low in fiber. Had childhood cancer that was treated with abdominal radiation. What are the signs or symptoms? Most polyps do not cause symptoms. If you have symptoms, they may include: Blood coming from your rectum when having a bowel movement. Blood in your stool. The stool may look dark red or black. Abdominal pain. A change in bowel habits, such as constipation or diarrhea. How is this diagnosed? This condition is diagnosed with a colonoscopy. This is a procedure in which a lighted, flexible scope is inserted into the anus and then passed into the colon to examine the area. Polyps are sometimes found when a colonoscopy is done as part of routine cancer screening tests. How is this treated? Treatment for this condition involves removing any polyps that are found. Most polyps can be removed during a colonoscopy. Those polyps will then be tested for cancer. Additional treatment may be needed depending on  the results of testing. Follow these instructions at home: Lifestyle Maintain a healthy weight, or lose weight if recommended by your health care provider. Exercise every day or as told by your health care provider. Do not use any products that contain nicotine or tobacco, such as cigarettes and e-cigarettes. If you need help quitting, ask your health care provider. If you drink alcohol, limit how much you have: 0-1 drink a day for women. 0-2 drinks a day for men. Be aware of how much alcohol is in your drink. In the [place] one drink equals one 12 oz bottle of beer (355 mL), one 5 oz glass of wine (148 mL), or one 1 oz shot of hard liquor (44 mL). Eating and drinking  Eat foods that are high in fiber, such as fruits, vegetables, and whole grains. Eat foods that are high in calcium and vitamin D, such as milk, cheese, yogurt, eggs, liver, fish, and broccoli. Limit foods that are high in fat, such as fried foods and desserts. Limit the amount of red meat and processed meat you eat, such as hot dogs, sausage, bacon, and lunch meats. General instructions Keep all follow-up visits as told by your health care provider. This is important. This includes having regularly scheduled colonoscopies. Talk to your health care provider about when you need a colonoscopy. Contact a health care provider if: You have new or worsening bleeding during a bowel movement. You have new or increased blood in your stool. You have a change in bowel habits. You lose weight for no known reason. Summary Polyps are tissue growths inside the body. Polyps can grow in  many places, including the colon. Most colon polyps are noncancerous (benign), but some can become cancerous over time. This condition is diagnosed with a colonoscopy. Treatment for this condition involves removing any polyps that are found. Most polyps can be removed during a colonoscopy. This information is not intended to replace advice given to you by  your health care provider. Make sure you discuss any questions you have with your health care provider. Document Revised: 12/09/2017 Document Reviewed: 12/09/2017 Elsevier Patient Education  [place].  Colonoscopy Discharge Instructions  Read the instructions outlined below and refer to this sheet in the next few weeks. These discharge instructions provide you with general information on caring for yourself after you leave the hospital. Your doctor may also give you specific instructions. While your treatment has been planned according to the most current medical practices available, unavoidable complications occasionally occur. If you have any problems or questions after discharge, call Dr. Gala Romney at [phone]. ACTIVITY  You may resume your regular activity, but move at a slower pace for the next 24 hours.   Take frequent rest periods for the next 24 hours.   Walking will help get rid of the air and reduce the bloated feeling in your belly (abdomen).   No driving for 24 hours (because of the medicine (anesthesia) used during the test).    Do not sign any important legal documents or operate any machinery for 24 hours (because of the anesthesia used during the test).  NUTRITION  Drink plenty of fluids.   You may resume your normal diet as instructed by your doctor.   Begin with a light meal and progress to your normal diet. Heavy or fried foods are harder to digest and may make you feel sick to your stomach (nauseated).   Avoid alcoholic beverages for 24 hours or as instructed.  MEDICATIONS  You may resume your normal medications unless your doctor tells you otherwise.  WHAT YOU CAN EXPECT TODAY  Some feelings of bloating in the abdomen.   Passage of more gas than usual.   Spotting of blood in your stool or on the toilet paper.  IF YOU HAD POLYPS REMOVED DURING THE COLONOSCOPY:  No aspirin products for 7 days or as instructed.   No alcohol for 7 days or as  instructed.   Eat a soft diet for the next 24 hours.  FINDING OUT THE RESULTS OF YOUR TEST Not all test results are available during your visit. If your test results are not back during the visit, make an appointment with your caregiver to find out the results. Do not assume everything is normal if you have not heard from your caregiver or the medical facility. It is important for you to follow up on all of your test results.  SEEK IMMEDIATE MEDICAL ATTENTION IF:  You have more than a spotting of blood in your stool.   Your belly is swollen (abdominal distention).   You are nauseated or vomiting.   You have a temperature over 101.   You have abdominal pain or discomfort that is severe or gets worse throughout the day.    Colon polyp information provided  Further recommendations to follow pending review of pathology report  At patient request I called Chyvonne Lueke at [phone] and reviewed results

## 2019-12-07 NOTE — Op Note (Signed)
[facility] Patient Name: Jodi Chaney Procedure Date: 12/07/2019 11:05 AM MRN: [id number] Date of Birth: September 14, 1970 Attending MD: Gennette Pac , MD CSN: [id number] Age: 49 Admit Type: Outpatient Procedure:                Colonoscopy Indications:              Screening for colorectal malignant neoplasm Providers:                Gennette Pac, MD, Jannett Celestine, RN, Burke Keels, Technician Referring MD:              Medicines:                Propofol per Anesthesia Complications:            No immediate complications. Estimated Blood Loss:     Estimated blood loss was minimal. Estimated blood                            loss was minimal. Procedure:                Pre-Anesthesia Assessment:                           - Prior to the procedure, a History and Physical                            was performed, and patient medications and                            allergies were reviewed. The patient's tolerance of                            previous anesthesia was also reviewed. The risks                            and benefits of the procedure and the sedation                            options and risks were discussed with the patient.                            All questions were answered, and informed consent                            was obtained. Prior Anticoagulants: The patient has                            taken no previous anticoagulant or antiplatelet                            agents. ASA Grade Assessment: II - A patient with  mild systemic disease. After reviewing the risks                            and benefits, the patient was deemed in                            satisfactory condition to undergo the procedure.                           After obtaining informed consent, the colonoscope                            was passed under direct vision. Throughout the   procedure, the patient's blood pressure, pulse, and                            oxygen saturations were monitored continuously. The                            CF-[id number] ([id number]) scope was introduced through                            the anus and advanced to the the cecum, identified                            by appendiceal orifice and ileocecal valve. The                            colonoscopy was performed without difficulty. The                            patient tolerated the procedure well. The quality                            of the bowel preparation was adequate. Scope In: 11:25:43 AM Scope Out: 11:38:04 AM Scope Withdrawal Time: 0 hours 9 minutes 52 seconds  Total Procedure Duration: 0 hours 12 minutes 21 seconds  Findings:      The perianal and digital rectal examinations were normal.      A 5 mm polyp was found in the sigmoid colon. The polyp was sessile. The       polyp was removed with a cold snare. Resection and retrieval were       complete. Estimated blood loss was minimal.      The exam was otherwise without abnormality on direct and retroflexion       views. Impression:               - One 5 mm polyp in the sigmoid colon, removed with                            a cold snare. Resected and retrieved.                           - The examination was otherwise normal on direct  and retroflexion views. Moderate Sedation:      Moderate (conscious) sedation was personally administered by an       anesthesia professional. The following parameters were monitored: oxygen       saturation, heart rate, blood pressure, respiratory rate, EKG, adequacy       of pulmonary ventilation, and response to care. Recommendation:           - Patient has a contact number available for                            emergencies. The signs and symptoms of potential                            delayed complications were discussed with the                            patient.  Return to normal activities tomorrow.                            Written discharge instructions were provided to the                            patient.                           - Resume previous diet.                           - Continue present medications.                           - Repeat colonoscopy date to be determined after                            pending pathology results are reviewed for                            surveillance based on pathology results.                           - Return to GI office (date not yet determined). Procedure Code(s):        --- Professional ---                           [phone], Colonoscopy, flexible; with removal of                            tumor(s), polyp(s), or other lesion(s) by snare                            technique Diagnosis Code(s):        --- Professional ---                           Z12.11, Encounter for screening for malignant  neoplasm of colon                           K63.5, Polyp of colon CPT copyright 2019 American Medical Association. All rights reserved. The codes documented in this report are preliminary and upon coder review may  be revised to meet current compliance requirements. Gerrit Friends. Kursten Kruk, MD Gennette Pac, MD 12/07/2019 11:43:52 AM This report has been signed electronically. Number of Addenda: 0

## 2019-12-11 ENCOUNTER — Encounter: Payer: Self-pay | Admitting: Internal Medicine

## 2019-12-11 LAB — SURGICAL PATHOLOGY

## 2019-12-13 ENCOUNTER — Encounter: Payer: Self-pay | Admitting: Orthopedic Surgery

## 2019-12-13 ENCOUNTER — Ambulatory Visit: Payer: BC Managed Care – PPO | Admitting: Orthopedic Surgery

## 2019-12-13 NOTE — Progress Notes (Deleted)
No chief complaint on file.   Encounter Diagnoses  Name Primary?  . Chronic pain of left knee Yes  . Body mass index 45.0-49.9, adult ([place])   . Morbid obesity ([place])     Follow-up visit 49 year old female with left knee pain  Previous history and treatment Very difficult to issue here.  The patient is overweight.  She is gainfully employed and has done an excellent job with that.  However she needs to lose weight and start some anti-inflammatory medication.  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   I discussed this with her and we both agreed to try anti-inflammatory medication first.   Follow-up 3 months   Recommend meloxicam 1 tablet twice a day she has not had any prior treatment in terms of medication other than occasional ibuprofen       Meds ordered this encounter  Medications  . meloxicam (MOBIC) 7.5 MG tablet      Sig: Take 1 tablet (7.5 mg total) by mouth 2 (two) times daily.      Dispense:  60 tablet      Refill:  5

## 2020-03-17 ENCOUNTER — Other Ambulatory Visit: Payer: Self-pay | Admitting: Orthopedic Surgery

## 2020-03-17 DIAGNOSIS — G8929 Other chronic pain: Secondary | ICD-10-CM

## 2020-04-17 ENCOUNTER — Other Ambulatory Visit: Payer: Self-pay

## 2020-04-17 ENCOUNTER — Inpatient Hospital Stay (HOSPITAL_COMMUNITY): Payer: BC Managed Care – PPO

## 2020-04-17 ENCOUNTER — Encounter (HOSPITAL_COMMUNITY): Payer: Self-pay | Admitting: Hematology

## 2020-04-17 ENCOUNTER — Inpatient Hospital Stay (HOSPITAL_COMMUNITY): Payer: BC Managed Care – PPO | Attending: Hematology | Admitting: Hematology

## 2020-04-17 VITALS — BP 117/60 | HR 75 | Temp 97.3°F | Resp 18 | Wt 235.2 lb

## 2020-04-17 DIAGNOSIS — Z8051 Family history of malignant neoplasm of kidney: Secondary | ICD-10-CM | POA: Insufficient documentation

## 2020-04-17 DIAGNOSIS — Z86010 Personal history of colonic polyps: Secondary | ICD-10-CM | POA: Diagnosis not present

## 2020-04-17 DIAGNOSIS — D509 Iron deficiency anemia, unspecified: Secondary | ICD-10-CM

## 2020-04-17 DIAGNOSIS — Z803 Family history of malignant neoplasm of breast: Secondary | ICD-10-CM | POA: Diagnosis not present

## 2020-04-17 DIAGNOSIS — Z87891 Personal history of nicotine dependence: Secondary | ICD-10-CM | POA: Diagnosis not present

## 2020-04-17 LAB — CBC WITH DIFFERENTIAL/PLATELET
Abs Immature Granulocytes: 0.01 10*3/uL (ref 0.00–0.07)
Basophils Absolute: 0 10*3/uL (ref 0.0–0.1)
Basophils Relative: 1 %
Eosinophils Absolute: 0.2 10*3/uL (ref 0.0–0.5)
Eosinophils Relative: 6 %
HCT: 30 % — ABNORMAL LOW (ref 36.0–46.0)
Hemoglobin: 8.8 g/dL — ABNORMAL LOW (ref 12.0–15.0)
Immature Granulocytes: 0 %
Lymphocytes Relative: 22 %
Lymphs Abs: 0.8 10*3/uL (ref 0.7–4.0)
MCH: 21.4 pg — ABNORMAL LOW (ref 26.0–34.0)
MCHC: 29.3 g/dL — ABNORMAL LOW (ref 30.0–36.0)
MCV: 72.8 fL — ABNORMAL LOW (ref 80.0–100.0)
Monocytes Absolute: 0.5 10*3/uL (ref 0.1–1.0)
Monocytes Relative: 13 %
Neutro Abs: 2 10*3/uL (ref 1.7–7.7)
Neutrophils Relative %: 58 %
Platelets: 286 10*3/uL (ref 150–400)
RBC: 4.12 MIL/uL (ref 3.87–5.11)
RDW: 17.4 % — ABNORMAL HIGH (ref 11.5–15.5)
WBC: 3.5 10*3/uL — ABNORMAL LOW (ref 4.0–10.5)
nRBC: 0 % (ref 0.0–0.2)

## 2020-04-17 LAB — IRON AND TIBC
Iron: 17 ug/dL — ABNORMAL LOW (ref 28–170)
Saturation Ratios: 3 % — ABNORMAL LOW (ref 10.4–31.8)
TIBC: 490 ug/dL — ABNORMAL HIGH (ref 250–450)
UIBC: 473 ug/dL

## 2020-04-17 LAB — RETICULOCYTES
Immature Retic Fract: 15.2 % (ref 2.3–15.9)
RBC.: 4.1 MIL/uL (ref 3.87–5.11)
Retic Count, Absolute: 60.3 10*3/uL (ref 19.0–186.0)
Retic Ct Pct: 1.5 % (ref 0.4–3.1)

## 2020-04-17 LAB — LACTATE DEHYDROGENASE: LDH: 124 U/L (ref 98–192)

## 2020-04-17 LAB — FOLATE
Folate: 15.8 ng/mL (ref >5.9)
Folate: 15 ng/mL (ref >5.9)

## 2020-04-17 LAB — VITAMIN B12
Vitamin B-12: 201 pg/mL (ref 180–914)
Vitamin B-12: 209 pg/mL (ref 180–914)

## 2020-04-17 LAB — FERRITIN: Ferritin: 2 ng/mL — ABNORMAL LOW (ref 11–307)

## 2020-04-17 NOTE — Patient Instructions (Signed)
[place] at [facility] Discharge Instructions  You were seen today by Dr. Delton Coombes. He went over your recent results; your blood work shows microcytic anemia. You will have additional blood work done today. You will also be scheduled to receive 2 iron infusions with a week apart. Dr. Delton Coombes will see you back in 2 months for labs and follow up.   Thank you for choosing [place] at [facility] to provide your oncology and hematology care.  To afford each patient quality time with our provider, please arrive at least 15 minutes before your scheduled appointment time.   If you have a lab appointment with the [place] please come in thru the Main Entrance and check in at the main information desk  You need to re-schedule your appointment should you arrive 10 or more minutes late.  We strive to give you quality time with our providers, and arriving late affects you and other patients whose appointments are after yours.  Also, if you no show three or more times for appointments you may be dismissed from the clinic at the providers discretion.     Again, thank you for choosing [facility].  Our hope is that these requests will decrease the amount of time that you wait before being seen by our physicians.       _____________________________________________________________  Should you have questions after your visit to [facility], please contact our office at [phone] between the hours of 8:00 a.m. and 4:30 p.m.  Voicemails left after 4:00 p.m. will not be returned until the following business day.  For prescription refill requests, have your pharmacy contact our office and allow 72 hours.    Cancer Center Support Programs:   > Cancer Support Group  2nd Tuesday of the month 1pm-2pm, Journey Room

## 2020-04-17 NOTE — Progress Notes (Signed)
[place], [place] [address]   CLINIC:  Medical Oncology/Hematology  Patient Care Team: Celene Squibb, MD as PCP - General (Internal Medicine) Gala Romney Cristopher Estimable, MD as Consulting Physician (Gastroenterology)  CHIEF COMPLAINTS/PURPOSE OF CONSULTATION:  Evaluation of microcytic anemia  HISTORY OF PRESENTING ILLNESS:  Jodi Chaney 49 y.o. female is here because of evaluation of microcytic anemia, at the request of Skeet Latch, NP at Dr. Josue Hector office. Her labs have shown microcytic anemia since at least 07/2014.  Today she reports that she used to take iron tablets 2 months ago and became constipated and ran out of Miralax, but was stopped by Skeet Latch since the anemia was not alleviated by it. She had a blood transfusion during her C-section in 1990s and an iron infusion in 08/01/2014. She denies recent F/C, night sweats, or unexpected weight loss. She denies CP, SOB with exertion, or lightheadedness. She reports having pica for ice and craving for [place] starch.  She continues menstruating every 28 days for 5 days. She works as an Furniture conservator/restorer pumps. She quit smoking at age of 71 and smoked 1/2 PPD for 12 years. Her dad used to come and get iron infusions weekly. Her sister deceased from breast cancer at age of 35; father had renal cancer and lived with one kidney; paternal uncles had multiple cancers; paternal GM had breast cancer. She gets mammograms regularly.   MEDICAL HISTORY:  Past Medical History:  Diagnosis Date  . Anemia   . Endometrial polyp 10/04/2014  . Fibroids 10/04/2014  . Menorrhagia 08/24/2014  . Thickened endometrium 10/04/2014   ?polyp to see Dr Elonda Husky about [facility] and ablation    SURGICAL HISTORY: Past Surgical History:  Procedure Laterality Date  . CESAREAN SECTION     x3  . COLONOSCOPY WITH PROPOFOL N/A 12/07/2019   Procedure: COLONOSCOPY WITH PROPOFOL;  Surgeon: Daneil Dolin, MD;  [facility]  [facility];  Service: Endoscopy;  Laterality: N/A;  12:15pm  . DILITATION & CURRETTAGE/HYSTROSCOPY WITH NOVASURE ABLATION N/A 10/24/2014   Procedure: HYSTEROSCOPY WITH NOVASURE ENDOMETRIAL ABLATION;  Surgeon: Florian Buff, MD;  Location: [facility] ORS;  Service: Gynecology;  Laterality: N/A;  Uterine Cavity Length 5.5cm Uterine Cavity Width 4.5cm power = 136 watts time = 55 seconds  . KNEE SURGERY Left    removal of bone-from being bow-legged- done X2.  Marland Kitchen POLYPECTOMY  12/07/2019   Procedure: POLYPECTOMY;  Surgeon: Daneil Dolin, MD;  Location: [facility];  Service: Endoscopy;;  cold snare Sigmoid polyp  . TUBAL LIGATION    . WISDOM TOOTH EXTRACTION      SOCIAL HISTORY: Social History   Socioeconomic History  . Marital status: Single    Spouse name: Not on file  . Number of children: 3  . Years of education: Not on file  . Highest education level: Not on file  Occupational History  . Not on file  Tobacco Use  . Smoking status: Former Smoker    Packs/day: 0.25    Years: 10.00    Pack years: 2.50    Types: Cigarettes    Quit date: 10/22/2007    Years since quitting: 12.4  . Smokeless tobacco: Never Used  Vaping Use  . Vaping Use: Never used  Substance and Sexual Activity  . Alcohol use: Not Currently    Comment: occ  . Drug use: No  . Sexual activity: Yes    Birth control/protection: None, Surgical  Comment: tubal  Other Topics Concern  . Not on file  Social History Narrative  . Not on file   Social Determinants of Health   Financial Resource Strain:   . Difficulty of Paying Living Expenses:   Food Insecurity:   . Worried About Charity fundraiser in the Last Year:   . Arboriculturist in the Last Year:   Transportation Needs:   . Film/video editor (Medical):   Marland Kitchen Lack of Transportation (Non-Medical):   Physical Activity:   . Days of Exercise per Week:   . Minutes of Exercise per Session:   Stress:   . Feeling of Stress :   Social Connections:   .  Frequency of Communication with Friends and Family:   . Frequency of Social Gatherings with Friends and Family:   . Attends Religious Services:   . Active Member of Clubs or Organizations:   . Attends Archivist Meetings:   Marland Kitchen Marital Status:   Intimate Partner Violence:   . Fear of Current or Ex-Partner:   . Emotionally Abused:   Marland Kitchen Physically Abused:   . Sexually Abused:     FAMILY HISTORY: Family History  Problem Relation Age of Onset  . Diabetes Mother   . Hypertension Father   . Other Father        has had open heart surgery; has one kidney  . Stroke Father   . Hypertension Sister   . Diabetes Brother   . Anxiety disorder Daughter   . Bipolar disorder Son   . Schizophrenia Son   . Diabetes Maternal Grandmother   . Kidney disease Maternal Grandfather   . Cancer Paternal Grandmother        breast  . Cancer Sister        breast  . Diabetes Brother        borderline  . Seizures Son   . Colon cancer Neg Hx     ALLERGIES:  has No Known Allergies.  MEDICATIONS:  Current Outpatient Medications  Medication Sig Dispense Refill  . calcium carbonate (TUMS - DOSED IN MG ELEMENTAL CALCIUM) 500 MG chewable tablet Chew 2 tablets by mouth 4 (four) times daily. Pt takes 2 tablets in the morning and 2 tablets at night    . cholecalciferol (VITAMIN D3) 25 MCG (1000 UNIT) tablet Take 1,000 Units by mouth daily.    . meloxicam (MOBIC) 7.5 MG tablet TAKE 1 TABLET (7.5 MG TOTAL) BY MOUTH 2 (TWO) TIMES DAILY. (Patient taking differently: Take 7.5 mg by mouth once a week. ) 60 tablet 5  . vitamin B-12 (CYANOCOBALAMIN) 1000 MCG tablet Take 1,000 mcg by mouth daily.    . furosemide (LASIX) 20 MG tablet Take 20 mg by mouth daily. (Patient not taking: Reported on 04/17/2020)     No current facility-administered medications for this visit.    REVIEW OF SYSTEMS:   Review of Systems  Constitutional: Positive for appetite change (moderately decreased) and fatigue (severe). Negative  for chills, diaphoresis, fever and unexpected weight change.  Respiratory: Negative for shortness of breath.   Cardiovascular: Negative for chest pain.  Skin: Negative for rash.  Neurological: Negative for light-headedness.  All other systems reviewed and are negative.    PHYSICAL EXAMINATION: ECOG PERFORMANCE STATUS: 1 - Symptomatic but completely ambulatory  Vitals:   04/17/20 0820  BP: 117/60  Pulse: 75  Resp: 18  Temp: (!) 97.3 F (36.3 C)  SpO2: 100%   Filed Weights   04/17/20  0820  Weight: 235 lb 3.2 oz (106.7 kg)   Physical Exam Vitals reviewed.  Constitutional:      Appearance: Normal appearance. She is obese.  Cardiovascular:     Rate and Rhythm: Normal rate and regular rhythm.     Pulses: Normal pulses.     Heart sounds: Normal heart sounds.  Pulmonary:     Effort: Pulmonary effort is normal.     Breath sounds: Normal breath sounds.  Abdominal:     Palpations: Abdomen is soft. There is no hepatomegaly, splenomegaly or mass.     Tenderness: There is no abdominal tenderness.     Hernia: No hernia is present.  Musculoskeletal:     Right lower leg: No edema.     Left lower leg: No edema.  Lymphadenopathy:     Cervical: No cervical adenopathy.     Upper Body:     Right upper body: No supraclavicular or axillary adenopathy.     Left upper body: No supraclavicular or axillary adenopathy.  Neurological:     General: No focal deficit present.     Mental Status: She is alert and oriented to person, place, and time.  Psychiatric:        Mood and Affect: Mood normal.        Behavior: Behavior normal.      LABORATORY DATA:  I have reviewed the data as listed No results found for this or any previous visit (from the past 2160 hour(s)).  RADIOGRAPHIC STUDIES: I have personally reviewed the radiological images as listed and agreed with the findings in the report. No results found.  ASSESSMENT:  1. Severe microcytic anemia: -CBC on 03/29/2020 shows  hemoglobin 8.7, MCV of 74 with decreased MCH and MCHC. White count and platelet count was normal. -Ferritin was six and percent saturation of four. Creatinine was 0.68. -Colonoscopy on 12/07/2019 showed 5 mm polyp in the sigmoid colon and normal rest of the colon. -She has tried 2 months of iron tablet and stopped it secondary to constipation. -She has ice pica and also cravings for cornstarch. -She received Feraheme in November 2015. -She has history of 3 units of blood transfusion in 1991 during her pregnancy. -She is actively menstruating, 5 days every 28 days, moderate flow.  2. 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 -Quit smoking 36 years ago and smoked 3 to 4 cigarettes for 10 years. -Sister died of breast cancer at age 51. Father had kidney cancer. Paternal grandmother died of breast cancer.    PLAN:  1. Severe microcytic anemia: -She has severe iron deficiency anemia, likely from blood loss and decreased absorption. -Cannot tolerate oral iron therapy secondary to severe constipation. Even after taking iron therapy, her hemoglobin did not improve well. -Have recommended 2 infusions of Feraheme weekly x2. We discussed the side effects in detail. -We will also rule out other deficiencies by checking O87, folic acid, methylmalonic acid and copper levels. We will rule out hemolysis by checking LDH, reticulocyte count and haptoglobin. We will also check SPEP. -I plan to see her back in 2 months with repeat ferritin, iron panel and CBC.   All questions were answered. The patient knows to call the clinic with any problems, questions or concerns.   Derek Jack, MD 04/17/20 8:54 AM  [place] [phone]   I, Milinda Antis, am acting as a scribe for Dr. Sanda Linger.  I, Derek Jack MD, have reviewed the above documentation for  accuracy and completeness, and I agree with the above.

## 2020-04-18 LAB — PROTEIN ELECTROPHORESIS, SERUM
A/G Ratio: 1.1 (ref 0.7–1.7)
Albumin ELP: 3.4 g/dL (ref 2.9–4.4)
Alpha-1-Globulin: 0.2 g/dL (ref 0.0–0.4)
Alpha-2-Globulin: 0.5 g/dL (ref 0.4–1.0)
Beta Globulin: 1.1 g/dL (ref 0.7–1.3)
Gamma Globulin: 1.3 g/dL (ref 0.4–1.8)
Globulin, Total: 3.1 g/dL (ref 2.2–3.9)
Total Protein ELP: 6.5 g/dL (ref 6.0–8.5)

## 2020-04-18 LAB — HAPTOGLOBIN: Haptoglobin: 72 mg/dL (ref 42–296)

## 2020-04-19 ENCOUNTER — Ambulatory Visit (HOSPITAL_COMMUNITY): Payer: BC Managed Care – PPO | Admitting: Hematology

## 2020-04-19 LAB — COPPER, SERUM: Copper: 152 ug/dL (ref 80–158)

## 2020-04-22 LAB — METHYLMALONIC ACID, SERUM: Methylmalonic Acid, Quantitative: 51 nmol/L (ref 0–378)

## 2020-04-26 ENCOUNTER — Other Ambulatory Visit: Payer: Self-pay

## 2020-04-26 ENCOUNTER — Encounter (HOSPITAL_COMMUNITY): Payer: Self-pay

## 2020-04-26 ENCOUNTER — Inpatient Hospital Stay (HOSPITAL_COMMUNITY): Payer: BC Managed Care – PPO

## 2020-04-26 VITALS — BP 105/47 | HR 71 | Temp 97.1°F | Resp 17

## 2020-04-26 DIAGNOSIS — D509 Iron deficiency anemia, unspecified: Secondary | ICD-10-CM | POA: Diagnosis not present

## 2020-04-26 MED ORDER — SODIUM CHLORIDE 0.9 % IV SOLN
510.0000 mg | Freq: Once | INTRAVENOUS | Status: AC
  Administered 2020-04-26: 510 mg via INTRAVENOUS
  Filled 2020-04-26: qty 510

## 2020-04-26 MED ORDER — SODIUM CHLORIDE 0.9 % IV SOLN: Freq: Once | INTRAVENOUS | Status: AC

## 2020-04-26 NOTE — Progress Notes (Signed)
Iron infusion given per orders. Patient tolerated it well without problems. Vitals stable and discharged home from clinic ambulatory. Follow up as scheduled.  

## 2020-04-26 NOTE — Patient Instructions (Signed)
[facility] at [facility]  Discharge Instructions:   _______________________________________________________________  Thank you for choosing [facility] at [facility] to provide your oncology and hematology care.  To afford each patient quality time with our providers, please arrive at least 15 minutes before your scheduled appointment.  You need to re-schedule your appointment if you arrive 10 or more minutes late.  We strive to give you quality time with our providers, and arriving late affects you and other patients whose appointments are after yours.  Also, if you no show three or more times for appointments you may be dismissed from the clinic.  Again, thank you for choosing [facility] at [facility]. Our hope is that these requests will allow you access to exceptional care and in a timely manner. _______________________________________________________________  If you have questions after your visit, please contact our office at [phone] between the hours of 8:30 a.m. and 5:00 p.m. Voicemails left after 4:30 p.m. will not be returned until the following business day. _______________________________________________________________  For prescription refill requests, have your pharmacy contact our office. _______________________________________________________________  Recommendations made by the consultant and any test results will be sent to your referring physician. _______________________________________________________________ 

## 2020-05-10 ENCOUNTER — Other Ambulatory Visit: Payer: Self-pay

## 2020-05-10 ENCOUNTER — Encounter (HOSPITAL_COMMUNITY): Payer: Self-pay

## 2020-05-10 ENCOUNTER — Inpatient Hospital Stay (HOSPITAL_COMMUNITY): Payer: BC Managed Care – PPO | Attending: Hematology

## 2020-05-10 VITALS — BP 113/39 | HR 66 | Temp 97.2°F | Resp 18

## 2020-05-10 DIAGNOSIS — D509 Iron deficiency anemia, unspecified: Secondary | ICD-10-CM | POA: Diagnosis not present

## 2020-05-10 MED ORDER — SODIUM CHLORIDE 0.9 % IV SOLN
510.0000 mg | Freq: Once | INTRAVENOUS | Status: AC
  Administered 2020-05-10: 510 mg via INTRAVENOUS
  Filled 2020-05-10: qty 510

## 2020-05-10 MED ORDER — SODIUM CHLORIDE 0.9 % IV SOLN: Freq: Once | INTRAVENOUS | Status: AC

## 2020-05-10 NOTE — Progress Notes (Signed)
Patient tolerated iron infusion with no complaints voiced.  Peripheral IV site clean and dry with good blood return noted before and after infusion.  Band aid applied.  VSS with discharge and left in satisfactory condition with no s/s of distress noted.   

## 2020-06-07 ENCOUNTER — Other Ambulatory Visit (HOSPITAL_COMMUNITY): Payer: Self-pay

## 2020-06-07 DIAGNOSIS — D509 Iron deficiency anemia, unspecified: Secondary | ICD-10-CM

## 2020-06-10 ENCOUNTER — Inpatient Hospital Stay (HOSPITAL_COMMUNITY): Payer: BC Managed Care – PPO | Attending: Hematology

## 2020-06-10 ENCOUNTER — Other Ambulatory Visit: Payer: Self-pay

## 2020-06-10 DIAGNOSIS — D509 Iron deficiency anemia, unspecified: Secondary | ICD-10-CM | POA: Insufficient documentation

## 2020-06-10 DIAGNOSIS — N920 Excessive and frequent menstruation with regular cycle: Secondary | ICD-10-CM | POA: Insufficient documentation

## 2020-06-10 DIAGNOSIS — Z87891 Personal history of nicotine dependence: Secondary | ICD-10-CM | POA: Diagnosis not present

## 2020-06-10 DIAGNOSIS — E669 Obesity, unspecified: Secondary | ICD-10-CM | POA: Diagnosis not present

## 2020-06-10 LAB — CBC WITH DIFFERENTIAL/PLATELET
Abs Immature Granulocytes: 0.01 10*3/uL (ref 0.00–0.07)
Basophils Absolute: 0 10*3/uL (ref 0.0–0.1)
Basophils Relative: 1 %
Eosinophils Absolute: 0.3 10*3/uL (ref 0.0–0.5)
Eosinophils Relative: 6 %
HCT: 39 % (ref 36.0–46.0)
Hemoglobin: 12.1 g/dL (ref 12.0–15.0)
Immature Granulocytes: 0 %
Lymphocytes Relative: 22 %
Lymphs Abs: 0.9 10*3/uL (ref 0.7–4.0)
MCH: 25.2 pg — ABNORMAL LOW (ref 26.0–34.0)
MCHC: 31 g/dL (ref 30.0–36.0)
MCV: 81.1 fL (ref 80.0–100.0)
Monocytes Absolute: 0.5 10*3/uL (ref 0.1–1.0)
Monocytes Relative: 12 %
Neutro Abs: 2.4 10*3/uL (ref 1.7–7.7)
Neutrophils Relative %: 59 %
Platelets: 239 10*3/uL (ref 150–400)
RBC: 4.81 MIL/uL (ref 3.87–5.11)
RDW: 23.3 % — ABNORMAL HIGH (ref 11.5–15.5)
WBC: 4.1 10*3/uL (ref 4.0–10.5)
nRBC: 0 % (ref 0.0–0.2)

## 2020-06-10 LAB — IRON AND TIBC
Iron: 63 ug/dL (ref 28–170)
Saturation Ratios: 18 % (ref 10.4–31.8)
TIBC: 343 ug/dL (ref 250–450)
UIBC: 280 ug/dL

## 2020-06-10 LAB — FERRITIN: Ferritin: 103 ng/mL (ref 11–307)

## 2020-06-16 NOTE — Progress Notes (Signed)
[place], [place] [address]   CLINIC:  Medical Oncology/Hematology  Patient Care Team: Celene Squibb, MD as PCP - General (Internal Medicine) Gala Romney Cristopher Estimable, MD as Consulting Physician (Gastroenterology)  CHIEF COMPLAINTS/PURPOSE OF CONSULTATION:  Follow-up microcytic anemia  HISTORY OF PRESENTING ILLNESS:  Jodi Chaney 49 y.o. female is here because of evaluation of microcytic anemia, at the request of Skeet Latch, NP at Dr. Josue Hector office. Her labs have shown microcytic anemia since at least 07/2014.  She reports that she used to take iron tablets 2 months ago and became constipated and ran out of Miralax, but was stopped by Skeet Latch since the anemia was not alleviated by it. She had a blood transfusion during her C-section in 1990s and an iron infusion in 08/01/2014. She denies recent F/C, night sweats, or unexpected weight loss.   She continues menstruating every 28 days for 5 days. She works as an Furniture conservator/restorer pumps. She quit smoking at age of 42 and smoked 1/2 PPD for 12 years. Her dad used to come and get iron infusions weekly. Her sister deceased from breast cancer at age of 34; father had renal cancer and lived with one kidney; paternal uncles had multiple cancers; paternal GM had breast cancer. She gets mammograms regularly.  She was evaluated by Dr. Delton Coombes on 07/27/2020 and has received 2 IV Feraheme infusions.  She states she is feeling much better and has more energy.  She has occasional dizzy spells when she stands too quickly.  Felt tired this past weekend but states "my body needed rest".  Denies any bleeding per rectum, melena, hematochezia, nosebleeds.  Endorses monthly menstrual cycles.  Had ablation in the past and is interested in reaching out to her gynecologist for other recommendations to prevent further episodes of low iron levels.  She denies shortness of breath or chest pain.  No recent  infections.   MEDICAL HISTORY:  Past Medical History:  Diagnosis Date  . Anemia   . Endometrial polyp 10/04/2014  . Fibroids 10/04/2014  . Menorrhagia 08/24/2014  . Thickened endometrium 10/04/2014   ?polyp to see Dr Elonda Husky about [facility] and ablation    SURGICAL HISTORY: Past Surgical History:  Procedure Laterality Date  . CESAREAN SECTION     x3  . COLONOSCOPY WITH PROPOFOL N/A 12/07/2019   Procedure: COLONOSCOPY WITH PROPOFOL;  Surgeon: Daneil Dolin, MD;  Location: [facility];  Service: Endoscopy;  Laterality: N/A;  12:15pm  . DILITATION & CURRETTAGE/HYSTROSCOPY WITH NOVASURE ABLATION N/A 10/24/2014   Procedure: HYSTEROSCOPY WITH NOVASURE ENDOMETRIAL ABLATION;  Surgeon: Florian Buff, MD;  Location: [facility];  Service: Gynecology;  Laterality: N/A;  Uterine Cavity Length 5.5cm Uterine Cavity Width 4.5cm power = 136 watts time = 55 seconds  . KNEE SURGERY Left    removal of bone-from being bow-legged- done X2.  Marland Kitchen POLYPECTOMY  12/07/2019   Procedure: POLYPECTOMY;  Surgeon: Daneil Dolin, MD;  Location: [facility];  Service: Endoscopy;;  cold snare Sigmoid polyp  . TUBAL LIGATION    . WISDOM TOOTH EXTRACTION      SOCIAL HISTORY: Social History   Socioeconomic History  . Marital status: Single    Spouse name: Not on file  . Number of children: 3  . Years of education: Not on file  . Highest education level: Not on file  Occupational History  . Not on file  Tobacco Use  . Smoking status: Former  Smoker    Packs/day: 0.25    Years: 10.00    Pack years: 2.50    Types: Cigarettes    Quit date: 10/22/2007    Years since quitting: 12.6  . Smokeless tobacco: Never Used  Vaping Use  . Vaping Use: Never used  Substance and Sexual Activity  . Alcohol use: Not Currently    Comment: occ  . Drug use: No  . Sexual activity: Yes    Birth control/protection: None, Surgical    Comment: tubal  Other Topics Concern  . Not on file  Social History Narrative  . Not on file    Social Determinants of Health   Financial Resource Strain:   . Difficulty of Paying Living Expenses: Not on file  Food Insecurity:   . Worried About Charity fundraiser in the Last Year: Not on file  . Ran Out of Food in the Last Year: Not on file  Transportation Needs:   . Lack of Transportation (Medical): Not on file  . Lack of Transportation (Non-Medical): Not on file  Physical Activity:   . Days of Exercise per Week: Not on file  . Minutes of Exercise per Session: Not on file  Stress:   . Feeling of Stress : Not on file  Social Connections:   . Frequency of Communication with Friends and Family: Not on file  . Frequency of Social Gatherings with Friends and Family: Not on file  . Attends Religious Services: Not on file  . Active Member of Clubs or Organizations: Not on file  . Attends Archivist Meetings: Not on file  . Marital Status: Not on file  Intimate Partner Violence:   . Fear of Current or Ex-Partner: Not on file  . Emotionally Abused: Not on file  . Physically Abused: Not on file  . Sexually Abused: Not on file    FAMILY HISTORY: Family History  Problem Relation Age of Onset  . Diabetes Mother   . Hypertension Father   . Other Father        has had open heart surgery; has one kidney  . Stroke Father   . Hypertension Sister   . Diabetes Brother   . Anxiety disorder Daughter   . Bipolar disorder Son   . Schizophrenia Son   . Diabetes Maternal Grandmother   . Kidney disease Maternal Grandfather   . Cancer Paternal Grandmother        breast  . Cancer Sister        breast  . Diabetes Brother        borderline  . Seizures Son   . Colon cancer Neg Hx     ALLERGIES:  has No Known Allergies.  MEDICATIONS:  Current Outpatient Medications  Medication Sig Dispense Refill  . calcium carbonate (TUMS - DOSED IN MG ELEMENTAL CALCIUM) 500 MG chewable tablet Chew 2 tablets by mouth 4 (four) times daily. Pt takes 2 tablets in the morning and 2  tablets at night    . cholecalciferol (VITAMIN D3) 25 MCG (1000 UNIT) tablet Take 1,000 Units by mouth daily.    . furosemide (LASIX) 20 MG tablet Take 20 mg by mouth daily.     . meloxicam (MOBIC) 7.5 MG tablet TAKE 1 TABLET (7.5 MG TOTAL) BY MOUTH 2 (TWO) TIMES DAILY. (Patient taking differently: Take 7.5 mg by mouth once a week. ) 60 tablet 5  . vitamin B-12 (CYANOCOBALAMIN) 1000 MCG tablet Take 1,000 mcg by mouth daily.  No current facility-administered medications for this visit.    REVIEW OF SYSTEMS:   Review of Systems  Constitutional: Positive for fatigue (Much improved). Negative for appetite change, fever and unexpected weight change.  HENT:   Negative for nosebleeds, sore throat and trouble swallowing.   Eyes: Negative.   Respiratory: Negative.  Negative for cough, shortness of breath and wheezing.   Cardiovascular: Negative.  Negative for chest pain and leg swelling.  Gastrointestinal: Negative for abdominal pain, blood in stool, constipation, diarrhea, nausea and vomiting.  Endocrine: Negative.   Genitourinary: Negative.  Negative for bladder incontinence, hematuria and nocturia.   Musculoskeletal: Negative.  Negative for back pain and flank pain.  Skin: Negative.   Neurological: Negative.  Negative for dizziness, headaches, light-headedness and numbness.  Hematological: Negative.   Psychiatric/Behavioral: Negative.  Negative for confusion. The patient is not nervous/anxious.      PHYSICAL EXAMINATION: ECOG PERFORMANCE STATUS: 1 - Symptomatic but completely ambulatory  There were no vitals filed for this visit. There were no vitals filed for this visit. Physical Exam Vitals reviewed.  Constitutional:      Appearance: Normal appearance. She is obese.  Cardiovascular:     Rate and Rhythm: Normal rate and regular rhythm.     Pulses: Normal pulses.     Heart sounds: Normal heart sounds.  Pulmonary:     Effort: Pulmonary effort is normal.     Breath sounds: Normal  breath sounds.  Abdominal:     Palpations: Abdomen is soft. There is no hepatomegaly, splenomegaly or mass.     Tenderness: There is no abdominal tenderness.     Hernia: No hernia is present.  Musculoskeletal:     Right lower leg: No edema.     Left lower leg: No edema.  Lymphadenopathy:     Cervical: No cervical adenopathy.     Upper Body:     Right upper body: No supraclavicular or axillary adenopathy.     Left upper body: No supraclavicular or axillary adenopathy.  Neurological:     General: No focal deficit present.     Mental Status: She is alert and oriented to person, place, and time.  Psychiatric:        Mood and Affect: Mood normal.        Behavior: Behavior normal.      LABORATORY DATA:  I have reviewed the data as listed Recent Results (from the past 2160 hour(s))  Vitamin B12     Status: None   Collection Time: 04/17/20  9:21 AM  Result Value Ref Range   Vitamin B-12 209 180 - 914 pg/mL    Comment: (NOTE) This assay is not validated for testing neonatal or myeloproliferative syndrome specimens for Vitamin B12 levels. Performed at [facility], [address] [place], [place] [address]   Folate     Status: None   Collection Time: 04/17/20  9:21 AM  Result Value Ref Range   Folate 15.0 >5.9 ng/mL    Comment: Performed at [facility], [address], [place], [place] [address]  Protein electrophoresis, serum     Status: None   Collection Time: 04/17/20  9:21 AM  Result Value Ref Range   Total Protein ELP 6.5 6.0 - 8.5 g/dL   Albumin ELP 3.4 2.9 - 4.4 g/dL   Alpha-1-Globulin 0.2 0.0 - 0.4 g/dL   Alpha-2-Globulin 0.5 0.4 - 1.0 g/dL   Beta Globulin 1.1 0.7 - 1.3 g/dL   Gamma Globulin 1.3 0.4 - 1.8 g/dL  M-Spike, % Not Observed Not Observed g/dL   SPE Interp. Comment     Comment: (NOTE) The SPE pattern appears unremarkable. Evidence of monoclonal protein is not apparent. Performed At: [facility] [place], [place] [id number] Rush Farmer MD [id number]    Comment Comment     Comment: (NOTE) Protein electrophoresis scan will follow via computer, mail, or courier delivery.    Globulin, Total 3.1 2.2 - 3.9 g/dL   A/G Ratio 1.1 0.7 - 1.7  Lactate dehydrogenase     Status: None   Collection Time: 04/17/20  9:21 AM  Result Value Ref Range   LDH 124 98 - 192 U/L    Comment: Performed at [facility], [address] [place], [place] [address]  Reticulocytes     Status: None   Collection Time: 04/17/20  9:21 AM  Result Value Ref Range   Retic Ct Pct 1.5 0.4 - 3.1 %   RBC. 4.10 3.87 - 5.11 MIL/uL   Retic Count, Absolute 60.3 19.0 - 186.0 K/uL   Immature Retic Fract 15.2 2.3 - 15.9 %    Comment: Performed at [facility], [address], [place], [place] [address]  Methylmalonic acid, serum     Status: None   Collection Time: 04/17/20  9:21 AM  Result Value Ref Range   Methylmalonic Acid, Quantitative 51 0 - 378 nmol/L   Disclaimer: Comment     Comment: (NOTE) This test was developed and its performance characteristics determined by [facility]. It has not been cleared or approved by the Food and Drug Administration. Performed At: [facility] [place], [place] [id number] Rush Farmer MD [id number]   Haptoglobin     Status: None   Collection Time: 04/17/20  9:21 AM  Result Value Ref Range   Haptoglobin 72 42 - 296 mg/dL    Comment: (NOTE) Performed At: [facility] [place], [place] [id number] Rush Farmer MD [id number]   Copper, serum     Status: None   Collection Time: 04/17/20  9:21 AM  Result Value Ref Range   Copper 152 80 - 158 ug/dL    Comment: (NOTE) This test was developed and its performance characteristics determined by [facility]. It has not been cleared or approved by the Food and Drug Administration.                                Detection Limit = 5 Performed At: [facility] [place], [place] [id number] Rush Farmer MD [id number]   CBC with Differential     Status: Abnormal   Collection Time: 04/17/20  9:21 AM  Result Value Ref Range   WBC 3.5 (L) 4.0 - 10.5 K/uL   RBC 4.12 3.87 - 5.11 MIL/uL   Hemoglobin 8.8 (L) 12.0 - 15.0 g/dL    Comment: Reticulocyte Hemoglobin testing may be clinically indicated, consider ordering this additional test [id number]    HCT 30.0 (L) 36 - 46 %   MCV 72.8 (L) 80.0 - 100.0 fL   MCH 21.4 (L) 26.0 - 34.0 pg   MCHC 29.3 (L) 30.0 - 36.0 g/dL   RDW 17.4 (H) 11.5 - 15.5 %   Platelets 286 150 - 400 K/uL   nRBC 0.0 0.0 - 0.2 %   Neutrophils Relative % 58 %   Neutro Abs 2.0 1.7 - 7.7 K/uL   Lymphocytes Relative 22 %  Lymphs Abs 0.8 0.7 - 4.0 K/uL   Monocytes Relative 13 %   Monocytes Absolute 0.5 0.1 - 1.0 K/uL   Eosinophils Relative 6 %   Eosinophils Absolute 0.2 0 - 0 K/uL   Basophils Relative 1 %   Basophils Absolute 0.0 0 - 0 K/uL   Immature Granulocytes 0 %   Abs Immature Granulocytes 0.01 0.00 - 0.07 K/uL    Comment: Performed at [facility], [address] [place], [place] [address]  Iron and TIBC     Status: Abnormal   Collection Time: 04/17/20  9:21 AM  Result Value Ref Range   Iron 17 (L) 28 - 170 ug/dL   TIBC 490 (H) 250 - 450 ug/dL   Saturation Ratios 3 (L) 10.4 - 31.8 %   UIBC 473 ug/dL    Comment: Performed at [facility], [address], [place], [place] [address]  Ferritin     Status: Abnormal   Collection Time: 04/17/20  9:21 AM  Result Value Ref Range   Ferritin 2 (L) 11 - 307 ng/mL    Comment: Performed at [facility], [address], [place], [place] [address]  Vitamin B12     Status: None   Collection Time: 04/17/20  9:23 AM  Result Value Ref Range   Vitamin B-12 201 180 - 914 pg/mL    Comment: (NOTE) This assay is not validated for testing neonatal or myeloproliferative syndrome specimens for Vitamin B12 levels. Performed at [facility], [address] [place], [place] [address]   Folate     Status: None   Collection  Time: 04/17/20  9:23 AM  Result Value Ref Range   Folate 15.8 >5.9 ng/mL    Comment: Performed at [facility], [address] [place], [place] [address]  CBC with Differential     Status: Abnormal   Collection Time: 06/10/20  8:33 AM  Result Value Ref Range   WBC 4.1 4.0 - 10.5 K/uL   RBC 4.81 3.87 - 5.11 MIL/uL   Hemoglobin 12.1 12.0 - 15.0 g/dL   HCT 39.0 36 - 46 %   MCV 81.1 80.0 - 100.0 fL   MCH 25.2 (L) 26.0 - 34.0 pg   MCHC 31.0 30.0 - 36.0 g/dL   RDW 23.3 (H) 11.5 - 15.5 %   Platelets 239 150 - 400 K/uL   nRBC 0.0 0.0 - 0.2 %   Neutrophils Relative % 59 %   Neutro Abs 2.4 1.7 - 7.7 K/uL   Lymphocytes Relative 22 %   Lymphs Abs 0.9 0.7 - 4.0 K/uL   Monocytes Relative 12 %   Monocytes Absolute 0.5 0.1 - 1.0 K/uL   Eosinophils Relative 6 %   Eosinophils Absolute 0.3 0 - 0 K/uL   Basophils Relative 1 %   Basophils Absolute 0.0 0 - 0 K/uL   Immature Granulocytes 0 %   Abs Immature Granulocytes 0.01 0.00 - 0.07 K/uL    Comment: Performed at [facility], [address], [place], [place] [address]  Ferritin     Status: None   Collection Time: 06/10/20  8:34 AM  Result Value Ref Range   Ferritin 103 11 - 307 ng/mL    Comment: Performed at [facility], [address] [place], [place] [address]  Iron and TIBC     Status: None   Collection Time: 06/10/20  8:34 AM  Result Value Ref Range   Iron 63 28 - 170 ug/dL   TIBC 343 250 - 450  ug/dL   Saturation Ratios 18 10.4 - 31.8 %   UIBC 280 ug/dL    Comment: Performed at [facility], [address], [place], [place] [address]    RADIOGRAPHIC STUDIES: I have personally reviewed the radiological images as listed and agreed with the findings in the report. No results found.  ASSESSMENT:  1. Severe microcytic anemia: -CBC on 03/29/2020 shows hemoglobin 8.7, MCV of 74 with decreased MCH and MCHC. White count and platelet count was normal. -Ferritin was six and percent saturation of four. Creatinine was 0.68. -Colonoscopy on  12/07/2019 showed 5 mm polyp in the sigmoid colon and normal rest of the colon. -She has tried 2 months of iron tablet and stopped it secondary to constipation. -She has ice pica and also cravings for cornstarch. -She received Feraheme in November 2015. -She has history of 3 units of blood transfusion in 1991 during her pregnancy. -She is actively menstruating, 5 days every 28 days, moderate flow.  2. 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 -Quit smoking 36 years ago and smoked 3 to 4 cigarettes for 10 years. -Sister died of breast cancer at age 57. Father had kidney cancer. Paternal grandmother died of breast cancer.    PLAN:  1. Severe microcytic anemia: -She has severe iron deficiency anemia, likely from blood loss and decreased absorption. -Cannot tolerate oral iron therapy secondary to severe constipation. Even after taking iron therapy, her hemoglobin did not improve well. --Received 2 doses of IV Feraheme (04/26/2020, 05/10/2020) --Reviewed lab work from 06/10/2020.   --Hemoglobin improved from 8.8-12.1.  --Ferritin improved from 2-103.  --Iron improved from 17-63, iron saturations from 3% to 18%.  --No evidence of other [id number], folic acid, copper levels and methylmalonic acid are all normal.   --Hemolysis labs normal-haptoglobin 72, LDH and reticulocyte count  --SPEP within normal limits. 2.  Heavy menstrual cycles --Patient is interested in reaching out to gynecologist for recommendations to decrease bleeding. 3.  Obesity --Weight gain-current BMI 47.40 --Previously prescribed phentermine 37.5 mg per PCP. --Patient interested in restarting  Disposition- RTC in 4 months with repeat labs including ferritin, iron and CBC.  All questions were answered. The patient knows to call the clinic with any problems, questions or concerns.   Faythe Casa, NP 06/17/2020 12:59 PM  [place] [phone]

## 2020-06-17 ENCOUNTER — Inpatient Hospital Stay (HOSPITAL_BASED_OUTPATIENT_CLINIC_OR_DEPARTMENT_OTHER): Payer: BC Managed Care – PPO | Admitting: Oncology

## 2020-06-17 ENCOUNTER — Other Ambulatory Visit: Payer: Self-pay

## 2020-06-17 VITALS — BP 104/73 | HR 68 | Temp 96.7°F | Resp 18 | Wt 242.7 lb

## 2020-06-17 DIAGNOSIS — N920 Excessive and frequent menstruation with regular cycle: Secondary | ICD-10-CM | POA: Diagnosis not present

## 2020-06-17 DIAGNOSIS — D509 Iron deficiency anemia, unspecified: Secondary | ICD-10-CM | POA: Diagnosis not present

## 2020-06-25 ENCOUNTER — Other Ambulatory Visit (HOSPITAL_COMMUNITY): Payer: Self-pay | Admitting: Adult Health

## 2020-06-25 DIAGNOSIS — Z1231 Encounter for screening mammogram for malignant neoplasm of breast: Secondary | ICD-10-CM

## 2020-07-11 ENCOUNTER — Ambulatory Visit (HOSPITAL_COMMUNITY)
Admission: RE | Admit: 2020-07-11 | Discharge: 2020-07-11 | Disposition: A | Discharge status: Home / Self Care | Payer: BC Managed Care – PPO | Source: Ambulatory Visit | Attending: Adult Health | Admitting: Adult Health

## 2020-07-11 ENCOUNTER — Other Ambulatory Visit: Payer: Self-pay

## 2020-07-11 DIAGNOSIS — Z1231 Encounter for screening mammogram for malignant neoplasm of breast: Secondary | ICD-10-CM

## 2020-07-23 ENCOUNTER — Other Ambulatory Visit: Payer: BC Managed Care – PPO | Admitting: Adult Health

## 2020-08-12 ENCOUNTER — Ambulatory Visit (INDEPENDENT_AMBULATORY_CARE_PROVIDER_SITE_OTHER): Payer: BC Managed Care – PPO | Admitting: Adult Health

## 2020-08-12 ENCOUNTER — Other Ambulatory Visit: Payer: Self-pay

## 2020-08-12 ENCOUNTER — Encounter: Payer: Self-pay | Admitting: Adult Health

## 2020-08-12 VITALS — BP 117/63 | HR 73 | Ht 61.0 in | Wt 247.0 lb

## 2020-08-12 DIAGNOSIS — Z1211 Encounter for screening for malignant neoplasm of colon: Secondary | ICD-10-CM | POA: Diagnosis not present

## 2020-08-12 DIAGNOSIS — Z713 Dietary counseling and surveillance: Secondary | ICD-10-CM

## 2020-08-12 DIAGNOSIS — R635 Abnormal weight gain: Secondary | ICD-10-CM | POA: Insufficient documentation

## 2020-08-12 DIAGNOSIS — Z01419 Encounter for gynecological examination (general) (routine) without abnormal findings: Secondary | ICD-10-CM | POA: Diagnosis not present

## 2020-08-12 DIAGNOSIS — Z6841 Body Mass Index (BMI) 40.0 and over, adult: Secondary | ICD-10-CM | POA: Insufficient documentation

## 2020-08-12 LAB — HEMOCCULT GUIAC POC 1CARD (OFFICE): Fecal Occult Blood, POC: NEGATIVE

## 2020-08-12 MED ORDER — PHENTERMINE HCL 37.5 MG PO CAPS: 37.5000 mg | ORAL_CAPSULE | ORAL | 0 refills | Status: DC

## 2020-08-12 MED ORDER — MELATONIN 10 MG PO TBDP: ORAL_TABLET | ORAL | Status: DC

## 2020-08-12 NOTE — Progress Notes (Signed)
Patient ID: Jodi Chaney, female   DOB: Apr 13, 1971, 49 y.o.   MRN: [id number] History of Present Illness: Jodi Chaney is a 49 year old black female,single, [id number] in for a well woman gyn exam, she had a normal pap with negative HPV 10/05/2018. Has had iron infusion.  PCP is Dr Nevada Crane.   Current Medications, Allergies, Past Medical History, Past Surgical History, Family History and Social History were reviewed in [employer] record.     Review of Systems: Patient denies any headaches, hearing loss, fatigue, blurred vision, shortness of breath, chest pain, abdominal pain, problems with bowel movements, urination, or intercourse. No joint pain or mood swings. Still has period after ablation, may change every 2-3 hours  +weight gain, wants adipex Does not sleep well    Physical Exam:BP 117/63 (BP Location: Left Arm, Patient Position: Sitting, Cuff Size: Large)   Pulse 73   Ht 5\' 1"  (1.549 m)   Wt 247 lb (112 kg)   LMP 07/08/2020   BMI 46.67 kg/m  General:  Well developed, well nourished, no acute distress Skin:  Warm and dry Neck:  Midline trachea, normal thyroid, good ROM, no lymphadenopathy Lungs; Clear to auscultation bilaterally Breast:  No dominant palpable mass, retraction, or nipple discharge Cardiovascular: Regular rate and rhythm Abdomen:  Soft, non tender, no hepatosplenomegaly Pelvic:  External genitalia is normal in appearance, no lesions.  The vagina is normal in appearance. Urethra has no lesions or masses. The cervix is bulbous.  Uterus is felt to be normal size, shape, and contour.  No adnexal masses or tenderness noted.Bladder is non tender, no masses felt. Rectal: Good sphincter tone, no polyps, or hemorrhoids felt.  Hemoccult negative. Extremities/musculoskeletal:  No swelling or varicosities noted, no clubbing or cyanosis Psych:  No mood changes, alert and cooperative,seems happy AA is 2 Fall risk is low PHQ 9 score is 17, no SI, declines  meds says stressed with kids  Upstream - 08/12/20 1010      Pregnancy Intention Screening   Does the patient want to become pregnant in the next year? N/A    Does the patient's partner want to become pregnant in the next year? N/A    Would the patient like to discuss contraceptive options today? N/A      Contraception Wrap Up   Current Method Female Sterilization    End Method Female Sterilization    Contraception Counseling Provided No         Examination chaperoned by Tish, RN  Impression and Plan 1. Encounter for well woman exam with routine gynecological exam Physical in 1 year Pap in 2023 Mammogram yearly Colonsocopy per GI Labs with PCP Try 10 mg of melatonin at HS  2. Encounter for screening fecal occult blood testing   3. Weight gain   4. BMI 45.0-49.9, adult ([place])   5. Weight loss counseling, encounter for Decrease carbs and portions, go back to the gym Increase water and decrease juice Will rx adipex Recheck weight and BP in 4 weeks Meds ordered this encounter  Medications  . phentermine 37.5 MG capsule    Sig: Take 1 capsule (37.5 mg total) by mouth every morning.    Dispense:  30 capsule    Refill:  0    Order Specific Question:   Supervising Provider    Answer:   Elonda Husky, LUTHER H [[id number]]  . Melatonin 10 MG TBDP    Sig: Take 1 at bedtime    Order Specific Question:   Supervising  Provider    Answer:   Florian Buff [[id number]]

## 2020-08-16 ENCOUNTER — Encounter: Payer: Self-pay | Admitting: Adult Health

## 2020-09-25 ENCOUNTER — Ambulatory Visit: Payer: BC Managed Care – PPO | Admitting: Adult Health

## 2020-10-07 ENCOUNTER — Other Ambulatory Visit (HOSPITAL_COMMUNITY): Payer: Self-pay | Admitting: *Deleted

## 2020-10-07 DIAGNOSIS — D509 Iron deficiency anemia, unspecified: Secondary | ICD-10-CM

## 2020-10-08 ENCOUNTER — Other Ambulatory Visit: Payer: Self-pay

## 2020-10-08 ENCOUNTER — Inpatient Hospital Stay (HOSPITAL_COMMUNITY): Payer: BC Managed Care – PPO | Attending: Hematology

## 2020-10-08 DIAGNOSIS — Z87891 Personal history of nicotine dependence: Secondary | ICD-10-CM | POA: Insufficient documentation

## 2020-10-08 DIAGNOSIS — N920 Excessive and frequent menstruation with regular cycle: Secondary | ICD-10-CM | POA: Insufficient documentation

## 2020-10-08 DIAGNOSIS — D509 Iron deficiency anemia, unspecified: Secondary | ICD-10-CM | POA: Insufficient documentation

## 2020-10-08 DIAGNOSIS — Z79899 Other long term (current) drug therapy: Secondary | ICD-10-CM | POA: Insufficient documentation

## 2020-10-08 DIAGNOSIS — Z803 Family history of malignant neoplasm of breast: Secondary | ICD-10-CM | POA: Diagnosis not present

## 2020-10-08 LAB — COMPREHENSIVE METABOLIC PANEL
ALT: 17 U/L (ref 0–44)
AST: 22 U/L (ref 15–41)
Albumin: 3.4 g/dL — ABNORMAL LOW (ref 3.5–5.0)
Alkaline Phosphatase: 42 U/L (ref 38–126)
Anion gap: 6 (ref 5–15)
BUN: 10 mg/dL (ref 6–20)
CO2: 24 mmol/L (ref 22–32)
Calcium: 8.6 mg/dL — ABNORMAL LOW (ref 8.9–10.3)
Chloride: 105 mmol/L (ref 98–111)
Creatinine, Ser: 0.66 mg/dL (ref 0.44–1.00)
GFR, Estimated: >60 mL/min (ref >60)
Glucose, Bld: 96 mg/dL (ref 70–99)
Potassium: 3.6 mmol/L (ref 3.5–5.1)
Sodium: 135 mmol/L (ref 135–145)
Total Bilirubin: 0.6 mg/dL (ref 0.3–1.2)
Total Protein: 6.7 g/dL (ref 6.5–8.1)

## 2020-10-08 LAB — CBC WITH DIFFERENTIAL/PLATELET
Abs Immature Granulocytes: 0.01 10*3/uL (ref 0.00–0.07)
Basophils Absolute: 0 10*3/uL (ref 0.0–0.1)
Basophils Relative: 1 %
Eosinophils Absolute: 0.2 10*3/uL (ref 0.0–0.5)
Eosinophils Relative: 6 %
HCT: 34.6 % — ABNORMAL LOW (ref 36.0–46.0)
Hemoglobin: 10.9 g/dL — ABNORMAL LOW (ref 12.0–15.0)
Immature Granulocytes: 0 %
Lymphocytes Relative: 21 %
Lymphs Abs: 0.8 10*3/uL (ref 0.7–4.0)
MCH: 27.5 pg (ref 26.0–34.0)
MCHC: 31.5 g/dL (ref 30.0–36.0)
MCV: 87.2 fL (ref 80.0–100.0)
Monocytes Absolute: 0.5 10*3/uL (ref 0.1–1.0)
Monocytes Relative: 13 %
Neutro Abs: 2.4 10*3/uL (ref 1.7–7.7)
Neutrophils Relative %: 59 %
Platelets: 297 10*3/uL (ref 150–400)
RBC: 3.97 MIL/uL (ref 3.87–5.11)
RDW: 13.4 % (ref 11.5–15.5)
WBC: 4 10*3/uL (ref 4.0–10.5)
nRBC: 0 % (ref 0.0–0.2)

## 2020-10-08 LAB — FERRITIN: Ferritin: 12 ng/mL (ref 11–307)

## 2020-10-08 LAB — IRON AND TIBC
Iron: 40 ug/dL (ref 28–170)
Saturation Ratios: 11 % (ref 10.4–31.8)
TIBC: 356 ug/dL (ref 250–450)
UIBC: 316 ug/dL

## 2020-10-08 LAB — LACTATE DEHYDROGENASE: LDH: 120 U/L (ref 98–192)

## 2020-10-15 ENCOUNTER — Other Ambulatory Visit: Payer: Self-pay

## 2020-10-15 ENCOUNTER — Inpatient Hospital Stay (HOSPITAL_BASED_OUTPATIENT_CLINIC_OR_DEPARTMENT_OTHER): Payer: BC Managed Care – PPO | Admitting: Hematology

## 2020-10-15 VITALS — BP 115/71 | HR 89 | Resp 16 | Wt 245.4 lb

## 2020-10-15 DIAGNOSIS — D509 Iron deficiency anemia, unspecified: Secondary | ICD-10-CM | POA: Diagnosis not present

## 2020-10-15 NOTE — Patient Instructions (Addendum)
[place] [facility] Discharge Instructions  You were seen today by Dr. Delton Coombes. He went over your recent results. Start taking 1 tablet of iron daily, and if your constipation is manageable, take 2 tablets daily. Purchase a stool softener over the counter to manage your constipation. Dr. Delton Coombes will see you back in 6 weeks for labs and follow up.   Thank you for choosing [place] [facility] to provide your oncology and hematology care.  To afford each patient quality time with our provider, please arrive at least 15 minutes before your scheduled appointment time.   If you have a lab appointment with the [facility] please come in thru the Main Entrance and check in at the main information desk  You need to re-schedule your appointment should you arrive 10 or more minutes late.  We strive to give you quality time with our providers, and arriving late affects you and other patients whose appointments are after yours.  Also, if you no show three or more times for appointments you may be dismissed from the clinic at the providers discretion.     Again, thank you for choosing [facility].  Our hope is that these requests will decrease the amount of time that you wait before being seen by our physicians.       _____________________________________________________________  Should you have questions after your visit to [facility], please contact our office at [phone] between the hours of 8:00 a.m. and 4:30 p.m.  Voicemails left after 4:00 p.m. will not be returned until the following business day.  For prescription refill requests, have your pharmacy contact our office and allow 72 hours.    Cancer Center Support Programs:   > Cancer Support Group  2nd Tuesday of the month 1pm-2pm, Journey Room

## 2020-10-15 NOTE — Progress Notes (Signed)
[place], [place] [address]   CLINIC:  Medical Oncology/Hematology  PCP:  Celene Squibb, MD [address] [place] [address]  [phone]  REASON FOR VISIT:  Follow-up for microcytic anemia  PRIOR THERAPY: None  CURRENT THERAPY: Intermittent Feraheme last on 05/10/2020  INTERVAL HISTORY:  Ms. KAYSEY BERNDT, a 50 y.o. female, returns for routine follow-up for her microcytic anemia. Cash was last seen by Faythe Casa, NP, on 06/17/2020.  Today she reports feeling well. She notes that she had a period for 5 days, moderate to heavy on days 2-3, but the rest of the days were better. She denies pica, though she reports having a craving for chocolate just before her periods. She denies having hematuria, hematochezia or nose bleeds. She reports that her energy levels are okay. She tried taking iron tablets previously for about 1 month but they caused her constipation.   REVIEW OF SYSTEMS:  Review of Systems  Constitutional: Negative for appetite change and fatigue.  HENT:   Negative for nosebleeds.   Gastrointestinal: Negative for blood in stool.  Genitourinary: Negative for hematuria.   Musculoskeletal: Positive for arthralgias.  All other systems reviewed and are negative.   PAST MEDICAL/SURGICAL HISTORY:  Past Medical History:  Diagnosis Date  . Anemia   . Endometrial polyp 10/04/2014  . Fibroids 10/04/2014  . Menorrhagia 08/24/2014  . Thickened endometrium 10/04/2014   ?polyp to see Dr Elonda Husky about [facility] and ablation   Past Surgical History:  Procedure Laterality Date  . CESAREAN SECTION     x3  . COLONOSCOPY WITH PROPOFOL N/A 12/07/2019   Procedure: COLONOSCOPY WITH PROPOFOL;  Surgeon: Daneil Dolin, MD;  [facility];  Service: Endoscopy;  Laterality: N/A;  12:15pm  . DILITATION & CURRETTAGE/HYSTROSCOPY WITH NOVASURE ABLATION N/A 10/24/2014   Procedure: HYSTEROSCOPY WITH NOVASURE ENDOMETRIAL ABLATION;   Surgeon: Florian Buff, MD;  Location: [facility] OR[facility];  Service: Gynecology;  Laterality: N/A;  Uterine Cavity Length 5.5cm Uterine Cavity Width 4.5cm power = 136 watts time = 55 seconds  . KNEE SURGERY Left    removal of bone-from being bow-legged- done X2.  Marland Kitchen POLYPECTOMY  12/07/2019   Procedure: POLYPECTOMY;  Surgeon: Daneil Dolin, MD;  [facility]: [facility];  Service: Endoscopy;;  cold snare Sigmoid polyp  . TUBAL LIGATION    . WISDOM TOOTH EXTRACTION      SOCIAL HISTORY:  Social History   Socioeconomic History  . Marital status: Single    Spouse name: Not on file  . Number of children: 3  . Years of education: Not on file  . Highest education level: Not on file  Occupational History  . Not on file  Tobacco Use  . Smoking status: Former Smoker    Packs/day: 0.25    Years: 10.00    Pack years: 2.50    Types: Cigarettes    Quit date: 10/22/2007    Years since quitting: 12.9  . Smokeless tobacco: Never Used  Vaping Use  . Vaping Use: Never used  Substance and Sexual Activity  . Alcohol use: Not Currently    Comment: occ  . Drug use: No  . Sexual activity: Not Currently    Birth control/protection: None, Surgical    Comment: tubal  Other Topics Concern  . Not on file  Social History Narrative  . Not on file   Social Determinants of Health   Financial Resource Strain: High Risk  .  Difficulty of Paying Living Expenses: Very hard  Food Insecurity: Food Insecurity Present  . Worried About Charity fundraiser in the Last Year: Often true  . Ran Out of Food in the Last Year: Often true  Transportation Needs: No Transportation Needs  . Lack of Transportation (Medical): No  . Lack of Transportation (Non-Medical): No  Physical Activity: Inactive  . Days of Exercise per Week: 0 days  . Minutes of Exercise per Session: 0 min  Stress: Stress Concern Present  . Feeling of Stress : Very much  Social Connections: Moderately Integrated  . Frequency of Communication with Friends  and Family: More than three times a week  . Frequency of Social Gatherings with Friends and Family: More than three times a week  . Attends Religious Services: 1 to 4 times per year  . Active Member of Clubs or Organizations: No  . Attends Archivist Meetings: 1 to 4 times per year  . Marital Status: Never married  Intimate Partner Violence: Not At Risk  . Fear of Current or Ex-Partner: No  . Emotionally Abused: No  . Physically Abused: No  . Sexually Abused: No    FAMILY HISTORY:  Family History  Problem Relation Age of Onset  . Diabetes Mother   . Hypertension Father   . Other Father        has had open heart surgery; has one kidney  . Stroke Father   . Hypertension Sister   . Diabetes Brother   . Anxiety disorder Daughter   . Bipolar disorder Son   . Schizophrenia Son   . Diabetes Maternal Grandmother   . Kidney disease Maternal Grandfather   . Cancer Paternal Grandmother        breast  . Cancer Sister        breast  . Diabetes Brother        borderline  . Seizures Son   . Colon cancer Neg Hx     CURRENT MEDICATIONS:  Current Outpatient Medications  Medication Sig Dispense Refill  . cholecalciferol (VITAMIN D3) 25 MCG (1000 UNIT) tablet Take 1,000 Units by mouth daily.    . furosemide (LASIX) 20 MG tablet Take 20 mg by mouth daily.     . meloxicam (MOBIC) 7.5 MG tablet TAKE 1 TABLET (7.5 MG TOTAL) BY MOUTH 2 (TWO) TIMES DAILY. (Patient taking differently: Take 7.5 mg by mouth once a week.) 60 tablet 5  . phentermine 37.5 MG capsule Take 1 capsule (37.5 mg total) by mouth every morning. 30 capsule 0  . vitamin B-12 (CYANOCOBALAMIN) 1000 MCG tablet Take 1,000 mcg by mouth daily.    . calcium carbonate (TUMS - DOSED IN MG ELEMENTAL CALCIUM) 500 MG chewable tablet Chew 2 tablets by mouth 4 (four) times daily. Pt takes 2 tablets in the morning and 2 tablets at night (Patient not taking: Reported on 10/15/2020)    . Melatonin 10 MG TBDP Take 1 at bedtime (Patient  not taking: Reported on 10/15/2020)     No current facility-administered medications for this visit.    ALLERGIES:  No Known Allergies  PHYSICAL EXAM:  Performance status (ECOG): 1 - Symptomatic but completely ambulatory  Vitals:   10/15/20 0822 10/15/20 0841  BP: 115/71 115/71  Pulse: 89 89  Resp: 16 16  SpO2: 100% 100%   Wt Readings from Last 3 Encounters:  10/15/20 245 lb 6.4 oz (111.3 kg)  08/12/20 247 lb (112 kg)  06/17/20 242 lb 11.6 oz (110.1 kg)  Physical Exam Vitals reviewed.  Constitutional:      Appearance: Normal appearance. She is obese.  Cardiovascular:     Rate and Rhythm: Normal rate and regular rhythm.     Pulses: Normal pulses.     Heart sounds: Normal heart sounds.  Pulmonary:     Effort: Pulmonary effort is normal.     Breath sounds: Normal breath sounds.  Musculoskeletal:     Right lower leg: No edema.     Left lower leg: No edema.  Neurological:     General: No focal deficit present.     Mental Status: She is alert and oriented to person, place, and time.  Psychiatric:        Mood and Affect: Mood normal.        Behavior: Behavior normal.     LABORATORY DATA:  I have reviewed the labs as listed.  CBC Latest Ref Rng & Units 10/08/2020 06/10/2020 04/17/2020  WBC 4.0 - 10.5 K/uL 4.0 4.1 3.5(L)  Hemoglobin 12.0 - 15.0 g/dL 10.9(L) 12.1 8.8(L)  Hematocrit 36.0 - 46.0 % 34.6(L) 39.0 30.0(L)  Platelets 150 - 400 K/uL 297 239 286   CMP Latest Ref Rng & Units 10/08/2020 12/05/2019 03/21/2016  Glucose 70 - 99 mg/dL 96 110(H) 97  BUN 6 - 20 mg/dL 10 11 6   Creatinine 0.44 - 1.00 mg/dL 0.66 0.63 0.60  Sodium 135 - 145 mmol/L 135 138 139  Potassium 3.5 - 5.1 mmol/L 3.6 4.0 3.7  Chloride 98 - 111 mmol/L 105 108 112(H)  CO2 22 - 32 mmol/L 24 24 24   Calcium 8.9 - 10.3 mg/dL 8.6(L) 8.9 8.5(L)  Total Protein 6.5 - 8.1 g/dL 6.7 - -  Total Bilirubin 0.3 - 1.2 mg/dL 0.6 - -  Alkaline Phos 38 - 126 U/L 42 - -  AST 15 - 41 U/L 22 - -  ALT 0 - 44 U/L 17 - -       Component Value Date/Time   RBC 3.97 10/08/2020 0855   MCV 87.2 10/08/2020 0855   MCH 27.5 10/08/2020 0855   MCHC 31.5 10/08/2020 0855   RDW 13.4 10/08/2020 0855   LYMPHSABS 0.8 10/08/2020 0855   MONOABS 0.5 10/08/2020 0855   EOSABS 0.2 10/08/2020 0855   BASOSABS 0.0 10/08/2020 0855   Lab Results  Component Value Date   LDH 120 10/08/2020   LDH 124 04/17/2020   Lab Results  Component Value Date   TIBC 356 10/08/2020   TIBC 343 06/10/2020   TIBC 490 (H) 04/17/2020   FERRITIN 12 10/08/2020   FERRITIN 103 06/10/2020   FERRITIN 2 (L) 04/17/2020   IRONPCTSAT 11 10/08/2020   IRONPCTSAT 18 06/10/2020   IRONPCTSAT 3 (L) 04/17/2020    DIAGNOSTIC IMAGING:  I have independently reviewed the scans and discussed with the patient. No results found.   ASSESSMENT:  1. Severe microcytic anemia: -CBC on 03/29/2020 shows hemoglobin 8.7, MCV of 74 with decreased MCH and MCHC. White count and platelet count was normal. -Ferritin was six and percent saturation of four. Creatinine was 0.68. -Colonoscopy on 12/07/2019 showed 5 mm polyp in the sigmoid colon and normal rest of the colon. -She has tried 2 months of iron tablet and stopped it secondary to constipation. -She has ice pica and also cravings for cornstarch. -She received Feraheme in November 2015. -She has history of 3 units of blood transfusion in 1991 during her pregnancy. -She is actively menstruating, 5 days every 28 days, moderate flow.  2. 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 -Quit smoking 36 years ago and smoked 3 to 4 cigarettes for 10 years. -Sister died of breast cancer at age 86. Father had kidney cancer. Paternal grandmother died of breast cancer.   PLAN:  1. Severe microcytic anemia: -She received 2 infusions of Feraheme. -Reviewed labs from 10/08/2020 which showed hemoglobin 10.9 with MCV 87. -However ferritin is still low at 12. -She has heavy bleeding during  second and third day of each menstrual cycle.  No bleeding per rectum noted.  Other nutritional deficiencies were ruled out.  SPEP was negative. -She wants to try taking iron tablet with stool softener. -RTC 6 weeks for follow-up with repeat labs.  If there is no improvement, will order parenteral iron therapy.  Orders placed this encounter:  Orders Placed This Encounter  Procedures  . CBC with Differential/Platelet  . Ferritin  . Iron and TIBC     Derek Jack, MD [place] [phone]   I, Milinda Antis, am acting as a scribe for Dr. Sanda Linger.  I, Derek Jack MD, have reviewed the above documentation for accuracy and completeness, and I agree with the above.

## 2020-11-21 ENCOUNTER — Inpatient Hospital Stay (HOSPITAL_COMMUNITY): Payer: BC Managed Care – PPO | Attending: Hematology

## 2020-11-21 DIAGNOSIS — Z87891 Personal history of nicotine dependence: Secondary | ICD-10-CM | POA: Insufficient documentation

## 2020-11-21 DIAGNOSIS — M7989 Other specified soft tissue disorders: Secondary | ICD-10-CM | POA: Insufficient documentation

## 2020-11-21 DIAGNOSIS — D509 Iron deficiency anemia, unspecified: Secondary | ICD-10-CM | POA: Insufficient documentation

## 2020-11-21 DIAGNOSIS — Z79899 Other long term (current) drug therapy: Secondary | ICD-10-CM | POA: Insufficient documentation

## 2020-11-25 ENCOUNTER — Inpatient Hospital Stay (HOSPITAL_COMMUNITY): Payer: BC Managed Care – PPO | Attending: Hematology

## 2020-11-25 ENCOUNTER — Other Ambulatory Visit (HOSPITAL_COMMUNITY): Payer: BC Managed Care – PPO

## 2020-11-25 ENCOUNTER — Other Ambulatory Visit: Payer: Self-pay

## 2020-11-25 DIAGNOSIS — D509 Iron deficiency anemia, unspecified: Secondary | ICD-10-CM | POA: Diagnosis not present

## 2020-11-25 LAB — CBC WITH DIFFERENTIAL/PLATELET
Abs Immature Granulocytes: 0.01 10*3/uL (ref 0.00–0.07)
Basophils Absolute: 0 10*3/uL (ref 0.0–0.1)
Basophils Relative: 1 %
Eosinophils Absolute: 0.2 10*3/uL (ref 0.0–0.5)
Eosinophils Relative: 5 %
HCT: 36.4 % (ref 36.0–46.0)
Hemoglobin: 11.5 g/dL — ABNORMAL LOW (ref 12.0–15.0)
Immature Granulocytes: 0 %
Lymphocytes Relative: 19 %
Lymphs Abs: 0.8 10*3/uL (ref 0.7–4.0)
MCH: 27 pg (ref 26.0–34.0)
MCHC: 31.6 g/dL (ref 30.0–36.0)
MCV: 85.4 fL (ref 80.0–100.0)
Monocytes Absolute: 0.6 10*3/uL (ref 0.1–1.0)
Monocytes Relative: 14 %
Neutro Abs: 2.5 10*3/uL (ref 1.7–7.7)
Neutrophils Relative %: 61 %
Platelets: 269 10*3/uL (ref 150–400)
RBC: 4.26 MIL/uL (ref 3.87–5.11)
RDW: 14 % (ref 11.5–15.5)
WBC: 4 10*3/uL (ref 4.0–10.5)
nRBC: 0 % (ref 0.0–0.2)

## 2020-11-25 LAB — FERRITIN: Ferritin: 6 ng/mL — ABNORMAL LOW (ref 11–307)

## 2020-11-25 LAB — IRON AND TIBC
Iron: 28 ug/dL (ref 28–170)
Saturation Ratios: 7 % — ABNORMAL LOW (ref 10.4–31.8)
TIBC: 417 ug/dL (ref 250–450)
UIBC: 389 ug/dL

## 2020-11-25 MED ORDER — OCTREOTIDE ACETATE 30 MG IM KIT
PACK | INTRAMUSCULAR | Status: AC
  Filled 2020-11-25: qty 1

## 2020-11-26 ENCOUNTER — Inpatient Hospital Stay (HOSPITAL_BASED_OUTPATIENT_CLINIC_OR_DEPARTMENT_OTHER): Payer: BC Managed Care – PPO | Admitting: Oncology

## 2020-11-26 ENCOUNTER — Other Ambulatory Visit: Payer: Self-pay

## 2020-11-26 VITALS — BP 97/63 | HR 86 | Temp 97.0°F | Resp 18 | Wt 246.0 lb

## 2020-11-26 DIAGNOSIS — Z87891 Personal history of nicotine dependence: Secondary | ICD-10-CM | POA: Diagnosis not present

## 2020-11-26 DIAGNOSIS — D509 Iron deficiency anemia, unspecified: Secondary | ICD-10-CM | POA: Diagnosis present

## 2020-11-26 DIAGNOSIS — Z79899 Other long term (current) drug therapy: Secondary | ICD-10-CM | POA: Diagnosis not present

## 2020-11-26 DIAGNOSIS — M7989 Other specified soft tissue disorders: Secondary | ICD-10-CM | POA: Diagnosis not present

## 2020-11-26 MED ORDER — FERROUS GLUCONATE 324 (38 FE) MG PO TABS: 324.0000 mg | ORAL_TABLET | Freq: Two times a day (BID) | ORAL | 3 refills | Status: DC

## 2020-11-26 NOTE — Progress Notes (Signed)
[place], [place] [id number]   CLINIC:  Medical Oncology/Hematology  PCP:  Celene Squibb, MD [address] [place] [address]  [phone]  REASON FOR VISIT:  Follow-up for microcytic anemia  PRIOR THERAPY: None  CURRENT THERAPY: Intermittent Feraheme last on 05/10/2020  INTERVAL HISTORY:  Jodi Chaney, a 50 y.o. female, returns for routine follow-up for her microcytic anemia. Jodi Chaney was last seen on 10/15/20.   Today, she reports feeling well and stable.  She is currently on her menstrual cycle.  States she feels her cycles is lighter than usual.  She denies any pica.  Having persistent knee pain bilaterally.  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  States she has been drinking half her body weight in water daily and trying to exercise each day after work.  Having bilateral lower extremity swelling and takes furosemide as needed.  She did not start her iron supplements as recommended because she was unsure of what to get.  She denies having hematuria, hematochezia or nose bleeds. She reports that her energy levels are okay.   REVIEW OF SYSTEMS:  Review of Systems  Constitutional: Negative for appetite change and fatigue.  HENT:   Negative for nosebleeds.   Gastrointestinal: Negative for blood in stool.  Genitourinary: Negative for hematuria.   Musculoskeletal: Positive for arthralgias.  All other systems reviewed and are negative.   PAST MEDICAL/SURGICAL HISTORY:  Past Medical History:  Diagnosis Date  . Anemia   . Endometrial polyp 10/04/2014  . Fibroids 10/04/2014  . Menorrhagia 08/24/2014  . Thickened endometrium 10/04/2014   ?polyp to see Dr Elonda Husky about [facility] and ablation   Past Surgical History:  Procedure Laterality Date  . CESAREAN SECTION     x3  . COLONOSCOPY WITH PROPOFOL N/A 12/07/2019   Procedure: COLONOSCOPY WITH PROPOFOL;  Surgeon: Daneil Dolin, MD;  [facility];  Service:  Endoscopy;  Laterality: N/A;  12:15pm  . DILITATION & CURRETTAGE/HYSTROSCOPY WITH NOVASURE ABLATION N/A 10/24/2014   Procedure: HYSTEROSCOPY WITH NOVASURE ENDOMETRIAL ABLATION;  Surgeon: Florian Buff, MD;  Location: [facility];  Service: Gynecology;  Laterality: N/A;  Uterine Cavity Length 5.5cm Uterine Cavity Width 4.5cm power = 136 watts time = 55 seconds  . KNEE SURGERY Left    removal of bone-from being bow-legged- done X2.  Marland Kitchen POLYPECTOMY  12/07/2019   Procedure: POLYPECTOMY;  Surgeon: Daneil Dolin, MD;  [facility];  Service: Endoscopy;;  cold snare Sigmoid polyp  . TUBAL LIGATION    . WISDOM TOOTH EXTRACTION      SOCIAL HISTORY:  Social History   Socioeconomic History  . Marital status: Single    Spouse name: Not on file  . Number of children: 3  . Years of education: Not on file  . Highest education level: Not on file  Occupational History  . Not on file  Tobacco Use  . Smoking status: Former Smoker    Packs/day: 0.25    Years: 10.00    Pack years: 2.50    Types: Cigarettes    Quit date: 10/22/2007    Years since quitting: 13.1  . Smokeless tobacco: Never Used  Vaping Use  . Vaping Use: Never used  Substance and Sexual Activity  . Alcohol use: Not Currently    Comment: occ  . Drug use: No  . Sexual activity: Not Currently    Birth control/protection: None, Surgical  Comment: tubal  Other Topics Concern  . Not on file  Social History Narrative  . Not on file   Social Determinants of Health   Financial Resource Strain: High Risk  . Difficulty of Paying Living Expenses: Very hard  Food Insecurity: Food Insecurity Present  . Worried About Charity fundraiser in the Last Year: Often true  . Ran Out of Food in the Last Year: Often true  Transportation Needs: No Transportation Needs  . Lack of Transportation (Medical): No  . Lack of Transportation (Non-Medical): No  Physical Activity: Inactive  . Days of Exercise per Week: 0 days  . Minutes of  Exercise per Session: 0 min  Stress: Stress Concern Present  . Feeling of Stress : Very much  Social Connections: Moderately Integrated  . Frequency of Communication with Friends and Family: More than three times a week  . Frequency of Social Gatherings with Friends and Family: More than three times a week  . Attends Religious Services: 1 to 4 times per year  . Active Member of Clubs or Organizations: No  . Attends Archivist Meetings: 1 to 4 times per year  . Marital Status: Never married  Intimate Partner Violence: Not At Risk  . Fear of Current or Ex-Partner: No  . Emotionally Abused: No  . Physically Abused: No  . Sexually Abused: No    FAMILY HISTORY:  Family History  Problem Relation Age of Onset  . Diabetes Mother   . Hypertension Father   . Other Father        has had open heart surgery; has one kidney  . Stroke Father   . Hypertension Sister   . Diabetes Brother   . Anxiety disorder Daughter   . Bipolar disorder Son   . Schizophrenia Son   . Diabetes Maternal Grandmother   . Kidney disease Maternal Grandfather   . Cancer Paternal Grandmother        breast  . Cancer Sister        breast  . Diabetes Brother        borderline  . Seizures Son   . Colon cancer Neg Hx     CURRENT MEDICATIONS:  Current Outpatient Medications  Medication Sig Dispense Refill  . calcium carbonate (TUMS - DOSED IN MG ELEMENTAL CALCIUM) 500 MG chewable tablet Chew 2 tablets by mouth as needed.    . cholecalciferol (VITAMIN D3) 25 MCG (1000 UNIT) tablet Take 1,000 Units by mouth daily.    . ferrous gluconate (FERGON) 324 MG tablet Take 1 tablet (324 mg total) by mouth 2 (two) times daily with a meal. 60 tablet 3  . furosemide (LASIX) 20 MG tablet Take 20 mg by mouth daily.     . Melatonin 10 MG TBDP Take 1 at bedtime    . meloxicam (MOBIC) 7.5 MG tablet TAKE 1 TABLET (7.5 MG TOTAL) BY MOUTH 2 (TWO) TIMES DAILY. (Patient taking differently: Take 7.5 mg by mouth once a week.) 60  tablet 5  . phentermine 37.5 MG capsule Take 1 capsule (37.5 mg total) by mouth every morning. 30 capsule 0  . vitamin B-12 (CYANOCOBALAMIN) 1000 MCG tablet Take 1,000 mcg by mouth as needed.     No current facility-administered medications for this visit.    ALLERGIES:  No Known Allergies  PHYSICAL EXAM:  Performance status (ECOG): 1 - Symptomatic but completely ambulatory  Vitals:   11/26/20 0823  BP: 97/63  Pulse: 86  Resp: 18  Temp: (!)  97 F (36.1 C)  SpO2: 100%   Wt Readings from Last 3 Encounters:  11/26/20 246 lb (111.6 kg)  10/15/20 245 lb 6.4 oz (111.3 kg)  08/12/20 247 lb (112 kg)   Physical Exam Vitals reviewed.  Constitutional:      Appearance: Normal appearance. She is obese.  Cardiovascular:     Rate and Rhythm: Normal rate and regular rhythm.     Pulses: Normal pulses.     Heart sounds: Normal heart sounds.  Pulmonary:     Effort: Pulmonary effort is normal.     Breath sounds: Normal breath sounds.  Musculoskeletal:     Right lower leg: No edema.     Left lower leg: No edema.  Neurological:     General: No focal deficit present.     Mental Status: She is alert and oriented to person, place, and time.  Psychiatric:        Mood and Affect: Mood normal.        Behavior: Behavior normal.     LABORATORY DATA:  I have reviewed the labs as listed.  CBC Latest Ref Rng & Units 11/25/2020 10/08/2020 06/10/2020  WBC 4.0 - 10.5 K/uL 4.0 4.0 4.1  Hemoglobin 12.0 - 15.0 g/dL 11.5(L) 10.9(L) 12.1  Hematocrit 36.0 - 46.0 % 36.4 34.6(L) 39.0  Platelets 150 - 400 K/uL 269 297 239   CMP Latest Ref Rng & Units 10/08/2020 12/05/2019 03/21/2016  Glucose 70 - 99 mg/dL 96 110(H) 97  BUN 6 - 20 mg/dL 10 11 6   Creatinine 0.44 - 1.00 mg/dL 0.66 0.63 0.60  Sodium 135 - 145 mmol/L 135 138 139  Potassium 3.5 - 5.1 mmol/L 3.6 4.0 3.7  Chloride 98 - 111 mmol/L 105 108 112(H)  CO2 22 - 32 mmol/L 24 24 24   Calcium 8.9 - 10.3 mg/dL 8.6(L) 8.9 8.5(L)  Total Protein 6.5 - 8.1  g/dL 6.7 - -  Total Bilirubin 0.3 - 1.2 mg/dL 0.6 - -  Alkaline Phos 38 - 126 U/L 42 - -  AST 15 - 41 U/L 22 - -  ALT 0 - 44 U/L 17 - -      Component Value Date/Time   RBC 4.26 11/25/2020 0824   MCV 85.4 11/25/2020 0824   MCH 27.0 11/25/2020 0824   MCHC 31.6 11/25/2020 0824   RDW 14.0 11/25/2020 0824   LYMPHSABS 0.8 11/25/2020 0824   MONOABS 0.6 11/25/2020 0824   EOSABS 0.2 11/25/2020 0824   BASOSABS 0.0 11/25/2020 0824   Lab Results  Component Value Date   LDH 120 10/08/2020   LDH 124 04/17/2020   Lab Results  Component Value Date   TIBC 417 11/25/2020   TIBC 356 10/08/2020   TIBC 343 06/10/2020   FERRITIN 6 (L) 11/25/2020   FERRITIN 12 10/08/2020   FERRITIN 103 06/10/2020   IRONPCTSAT 7 (L) 11/25/2020   IRONPCTSAT 11 10/08/2020   IRONPCTSAT 18 06/10/2020    DIAGNOSTIC IMAGING:  I have independently reviewed the scans and discussed with the patient. No results found.   ASSESSMENT:  1. Severe microcytic anemia: -CBC on 03/29/2020 shows hemoglobin 8.7, MCV of 74 with decreased MCH and MCHC. White count and platelet count was normal. -Ferritin was six and percent saturation of four. Creatinine was 0.68. -Colonoscopy on 12/07/2019 showed 5 mm polyp in the sigmoid colon and normal rest of the colon. -She has tried 2 months of iron tablet and stopped it secondary to constipation. -She has ice pica and also cravings  for cornstarch. -She received Feraheme in November 2015. -She has history of 3 units of blood transfusion in 1991 during her pregnancy. -She is actively menstruating, 5 days every 28 days, moderate flow.  2. 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 -Quit smoking 36 years ago and smoked 3 to 4 cigarettes for 10 years. -Sister died of breast cancer at age 90. Father had kidney cancer. Paternal grandmother died of breast cancer.   PLAN:  1. Severe microcytic anemia: -She received 2 infusions of Feraheme last  on 04/26/2020 and 05/10/2020.  -Reviewed labs from 11/25/2020 which show a hemoglobin of 11.5 with MCV of 85.4. -Ferritin is low at 6 with an iron saturation of 7% which is a decrease from previous. -Menstrual cycle appears to be lighter this month per patient. -She did not start oral iron as recommended because she was unsure of what to get. -She would like to try iron supplements will stool softeners and be seen back in 6 weeks for follow-up. -Prescription sent for ferrous gluconate 324 mg twice daily.  Patient states she has MiraLAX which she will take 1-2 times daily for GI symptoms.  Disposition: -Start iron supplementation. RX sent.  -RTC in 6 weeks (iron, ferritin and CBC) with repeat labs and MD assessment.  Greater than 50% was spent in counseling and coordination of care with this patient including but not limited to discussion of the relevant topics above (See A&P) including, but not limited to diagnosis and management of acute and chronic medical conditions.   Orders placed this encounter:  No orders of the defined types were placed in this encounter.  Faythe Casa, NP 11/26/2020 9:58 AM  [place] [phone]

## 2020-11-27 ENCOUNTER — Ambulatory Visit (HOSPITAL_COMMUNITY): Payer: BC Managed Care – PPO | Admitting: Hematology

## 2020-12-27 ENCOUNTER — Encounter (INDEPENDENT_AMBULATORY_CARE_PROVIDER_SITE_OTHER): Payer: Self-pay

## 2021-01-03 ENCOUNTER — Other Ambulatory Visit (HOSPITAL_COMMUNITY): Payer: Self-pay

## 2021-01-03 DIAGNOSIS — D509 Iron deficiency anemia, unspecified: Secondary | ICD-10-CM

## 2021-01-06 ENCOUNTER — Inpatient Hospital Stay (HOSPITAL_COMMUNITY): Payer: BC Managed Care – PPO | Attending: Hematology

## 2021-01-07 ENCOUNTER — Ambulatory Visit (HOSPITAL_COMMUNITY): Payer: BC Managed Care – PPO | Admitting: Hematology

## 2021-02-11 ENCOUNTER — Other Ambulatory Visit: Payer: Self-pay

## 2021-02-11 ENCOUNTER — Emergency Department (HOSPITAL_COMMUNITY)
Admission: EM | Admit: 2021-02-11 | Discharge: 2021-02-11 | Discharge status: Against Medical Advice | Payer: BC Managed Care – PPO

## 2021-02-11 NOTE — ED Triage Notes (Signed)
Left prior to triage

## 2021-02-27 ENCOUNTER — Other Ambulatory Visit (HOSPITAL_COMMUNITY): Payer: Self-pay | Admitting: Oncology

## 2021-02-28 ENCOUNTER — Encounter (HOSPITAL_COMMUNITY): Payer: Self-pay | Admitting: Hematology

## 2021-06-30 ENCOUNTER — Other Ambulatory Visit (HOSPITAL_COMMUNITY): Payer: Self-pay | Admitting: Family Medicine

## 2021-06-30 DIAGNOSIS — Z1231 Encounter for screening mammogram for malignant neoplasm of breast: Secondary | ICD-10-CM

## 2021-07-14 ENCOUNTER — Other Ambulatory Visit: Payer: Self-pay

## 2021-07-14 ENCOUNTER — Ambulatory Visit (HOSPITAL_COMMUNITY)
Admission: RE | Admit: 2021-07-14 | Discharge: 2021-07-14 | Disposition: A | Discharge status: Home / Self Care | Payer: BC Managed Care – PPO | Source: Ambulatory Visit | Attending: Family Medicine | Admitting: Family Medicine

## 2021-07-14 DIAGNOSIS — Z1231 Encounter for screening mammogram for malignant neoplasm of breast: Secondary | ICD-10-CM | POA: Insufficient documentation

## 2021-09-10 ENCOUNTER — Encounter: Payer: Self-pay | Admitting: Adult Health

## 2021-09-10 ENCOUNTER — Other Ambulatory Visit (HOSPITAL_COMMUNITY)
Admission: RE | Admit: 2021-09-10 | Discharge: 2021-09-10 | Disposition: A | Discharge status: Home / Self Care | Payer: BC Managed Care – PPO | Source: Ambulatory Visit | Attending: Adult Health | Admitting: Adult Health

## 2021-09-10 ENCOUNTER — Ambulatory Visit (INDEPENDENT_AMBULATORY_CARE_PROVIDER_SITE_OTHER): Payer: BC Managed Care – PPO | Admitting: Adult Health

## 2021-09-10 ENCOUNTER — Other Ambulatory Visit: Payer: Self-pay

## 2021-09-10 VITALS — BP 127/71 | HR 81 | Ht 61.0 in | Wt 245.0 lb

## 2021-09-10 DIAGNOSIS — Z01419 Encounter for gynecological examination (general) (routine) without abnormal findings: Secondary | ICD-10-CM

## 2021-09-10 DIAGNOSIS — Z1211 Encounter for screening for malignant neoplasm of colon: Secondary | ICD-10-CM

## 2021-09-10 DIAGNOSIS — N951 Menopausal and female climacteric states: Secondary | ICD-10-CM

## 2021-09-10 LAB — HEMOCCULT GUIAC POC 1CARD (OFFICE): Fecal Occult Blood, POC: NEGATIVE
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
## 2021-09-15 LAB — CYTOLOGY - PAP
Adequacy: ABSENT
Comment: NEGATIVE
Diagnosis: NEGATIVE
High risk HPV: NEGATIVE

## 2021-09-24 ENCOUNTER — Other Ambulatory Visit: Payer: BC Managed Care – PPO | Admitting: Adult Health

## 2022-04-15 ENCOUNTER — Encounter (INDEPENDENT_AMBULATORY_CARE_PROVIDER_SITE_OTHER): Payer: Self-pay

## 2022-06-18 ENCOUNTER — Other Ambulatory Visit (HOSPITAL_COMMUNITY): Payer: Self-pay | Admitting: Adult Health

## 2022-06-18 DIAGNOSIS — Z1231 Encounter for screening mammogram for malignant neoplasm of breast: Secondary | ICD-10-CM

## 2022-07-16 ENCOUNTER — Ambulatory Visit (HOSPITAL_COMMUNITY): Payer: BC Managed Care – PPO

## 2022-07-27 ENCOUNTER — Ambulatory Visit (HOSPITAL_COMMUNITY)
Admission: RE | Admit: 2022-07-27 | Discharge: 2022-07-27 | Disposition: A | Discharge status: Home / Self Care | Payer: BC Managed Care – PPO | Source: Ambulatory Visit | Attending: Adult Health | Admitting: Adult Health

## 2022-07-27 DIAGNOSIS — Z1231 Encounter for screening mammogram for malignant neoplasm of breast: Secondary | ICD-10-CM | POA: Insufficient documentation

## 2022-08-07 ENCOUNTER — Ambulatory Visit: Payer: BC Managed Care – PPO | Admitting: Orthopedic Surgery

## 2022-08-07 NOTE — Progress Notes (Deleted)
No chief complaint on file.

## 2022-08-24 ENCOUNTER — Ambulatory Visit (INDEPENDENT_AMBULATORY_CARE_PROVIDER_SITE_OTHER): Payer: BC Managed Care – PPO | Admitting: Orthopedic Surgery

## 2022-08-24 ENCOUNTER — Ambulatory Visit (INDEPENDENT_AMBULATORY_CARE_PROVIDER_SITE_OTHER): Payer: BC Managed Care – PPO

## 2022-08-24 ENCOUNTER — Encounter: Payer: Self-pay | Admitting: Orthopedic Surgery

## 2022-08-24 VITALS — BP 139/44 | HR 66 | Ht 61.0 in | Wt 242.0 lb

## 2022-08-24 DIAGNOSIS — M7120 Synovial cyst of popliteal space [Baker], unspecified knee: Secondary | ICD-10-CM

## 2022-08-24 DIAGNOSIS — R2242 Localized swelling, mass and lump, left lower limb: Secondary | ICD-10-CM

## 2022-08-24 DIAGNOSIS — R2241 Localized swelling, mass and lump, right lower limb: Secondary | ICD-10-CM

## 2022-08-24 DIAGNOSIS — R2243 Localized swelling, mass and lump, lower limb, bilateral: Secondary | ICD-10-CM | POA: Diagnosis not present

## 2022-08-24 DIAGNOSIS — M1712 Unilateral primary osteoarthritis, left knee: Secondary | ICD-10-CM | POA: Diagnosis not present

## 2022-08-24 DIAGNOSIS — M25562 Pain in left knee: Secondary | ICD-10-CM

## 2022-08-24 DIAGNOSIS — G8929 Other chronic pain: Secondary | ICD-10-CM

## 2022-08-24 DIAGNOSIS — M1711 Unilateral primary osteoarthritis, right knee: Secondary | ICD-10-CM | POA: Diagnosis not present

## 2022-08-24 NOTE — Progress Notes (Signed)
Chief Complaint  Patient presents with   Knee Pain    Both knees    51 year old female history of obesity BMI 46 microcytic anemia fibroids was initially sent here to evaluate a cyst but complains of bilateral knee pain.  Past Medical History:  Diagnosis Date   Anemia    Endometrial polyp 10/04/2014   Fibroids 10/04/2014   Menorrhagia 08/24/2014   Thickened endometrium 10/04/2014   ?polyp to see Dr Elonda Husky about [facility] and ablation   Past Surgical History:  Procedure Laterality Date   CESAREAN SECTION     x3   COLONOSCOPY WITH PROPOFOL N/A 12/07/2019   Procedure: COLONOSCOPY WITH PROPOFOL;  Surgeon: Daneil Dolin, MD;  [facility];  Service: Endoscopy;  Laterality: N/A;  12:15pm   DILITATION & CURRETTAGE/HYSTROSCOPY WITH NOVASURE ABLATION N/A 10/24/2014   Procedure: HYSTEROSCOPY WITH NOVASURE ENDOMETRIAL ABLATION;  Surgeon: Florian Buff, MD;  Location: [facility];  Service: Gynecology;  Laterality: N/A;  Uterine Cavity Length 5.5cm Uterine Cavity Width 4.5cm power = 136 watts time = 55 seconds   KNEE SURGERY Left    removal of bone-from being bow-legged- done X2.   POLYPECTOMY  12/07/2019   Procedure: POLYPECTOMY;  Surgeon: Daneil Dolin, MD;  [facility];  Service: Endoscopy;;  cold snare Sigmoid polyp   TUBAL LIGATION     WISDOM TOOTH EXTRACTION     BP (!) 139/44   Pulse 66   Ht '5\' 1"'$  (1.549 m)   Wt 242 lb (109.8 kg)   BMI 45.73 kg/m  The patient meets the AMA guidelines for Morbid (severe) obesity with a BMI > 40.0 and I have recommended weight loss. The patient has a scar over the left knee from the surgery she had years ago  She says the knees do not really hurt but the pain is in the back over 2 masses both on the lateral side pretty much in the lateral hamstring tendons which may be a synovial cyst versus a mass/lipoma versus liposarcoma   Imaging of the right knee shows moderate to severe arthritis with effusion  The left knee has a proximal femoral  deformity unclear etiology with osteoarthritis   Recommend MRI to evaluate the mass rule out liposarcoma  Encounter Diagnoses  Name Primary?   Pain in both knees, unspecified chronicity Yes   Synovial cyst of knee, unspecified laterality    Knee mass, left    Mass of right knee

## 2022-08-24 NOTE — Patient Instructions (Addendum)
While we are working on your approval for MRI please go ahead and call to schedule your appointment with [facility] within at least one (1) week.   Central Scheduling [phone]  

## 2022-09-16 ENCOUNTER — Ambulatory Visit (HOSPITAL_COMMUNITY)
Admission: RE | Admit: 2022-09-16 | Discharge: 2022-09-16 | Disposition: A | Discharge status: Home / Self Care | Payer: BC Managed Care – PPO | Source: Ambulatory Visit | Attending: Orthopedic Surgery | Admitting: Orthopedic Surgery

## 2022-09-16 DIAGNOSIS — R2241 Localized swelling, mass and lump, right lower limb: Secondary | ICD-10-CM

## 2022-09-16 DIAGNOSIS — M25561 Pain in right knee: Secondary | ICD-10-CM | POA: Diagnosis not present

## 2022-09-16 DIAGNOSIS — R2242 Localized swelling, mass and lump, left lower limb: Secondary | ICD-10-CM

## 2022-09-16 DIAGNOSIS — M25562 Pain in left knee: Secondary | ICD-10-CM

## 2022-09-17 ENCOUNTER — Telehealth: Payer: Self-pay | Admitting: Orthopedic Surgery

## 2022-09-17 ENCOUNTER — Telehealth: Payer: Self-pay | Admitting: Radiology

## 2022-09-17 DIAGNOSIS — M170 Bilateral primary osteoarthritis of knee: Secondary | ICD-10-CM

## 2022-09-17 NOTE — Telephone Encounter (Signed)
Put in referral to Dr Erlinda Hong or Dr Ninfa Linden

## 2022-09-17 NOTE — Telephone Encounter (Signed)
[name] has had surgery as a child for dislocated kneecap and proximal osteotomies for severe bowing at [facility] when she was a child  After 21 years she has had ongoing problems with her left knee eventually going to get an MRI for further evaluation of the intra-articular structures.  She has a proximal tibial deformity osteoarthritis of the knee deficient ACL torn medial meniscus  However, I am not sure how to proceed in her situation.  I think a total knee may require a custom implant for computer-assisted navigation  I have advised her that we will get a second opinion.  I think this type of deformity is beyond the scope of my expertise and should surgery be required then a joint specialist might be the better option

## 2022-09-17 NOTE — Telephone Encounter (Signed)
-----   Message from Carole Civil, MD sent at 09/17/2022  9:13 AM EST ----- I Loryn Haacke I saw Ms. Lupa's MRI and called her about the results  I would like her to see someone as a second opinion  I know you said Dr. Sherrian Divers does total knees so he is 1 option or Ninfa Linden or perhaps a [place] or work only this one is  pretty bad

## 2022-09-29 ENCOUNTER — Encounter (HOSPITAL_COMMUNITY): Payer: Self-pay | Admitting: Hematology

## 2022-09-29 ENCOUNTER — Ambulatory Visit (INDEPENDENT_AMBULATORY_CARE_PROVIDER_SITE_OTHER): Payer: BC Managed Care – PPO | Admitting: Orthopaedic Surgery

## 2022-09-29 DIAGNOSIS — Z6841 Body Mass Index (BMI) 40.0 and over, adult: Secondary | ICD-10-CM | POA: Diagnosis not present

## 2022-09-29 DIAGNOSIS — M1712 Unilateral primary osteoarthritis, left knee: Secondary | ICD-10-CM | POA: Diagnosis not present

## 2022-09-29 MED ORDER — ACETAMINOPHEN-CODEINE 300-30 MG PO TABS: 1.0000 | ORAL_TABLET | Freq: Every day | ORAL | 0 refills | Status: AC | PRN

## 2022-09-29 NOTE — Progress Notes (Signed)
Office Visit Note   Patient: Jodi Chaney           Date of Birth: Ch[birth year]           MRN: [id number] Visit Date: 09/29/2022              Requested by: Carole Civil, MD [address] [place],  [place] [address] PCP: Celene Squibb, MD   Assessment & Plan: Visit Diagnoses:  1. Primary osteoarthritis of left knee   2. Body mass index 45.0-49.9, adult (HCC)     Plan: Impression is posttraumatic left knee DJD.  X-rays and MRI findings were reviewed with the patient and treatment options were explained.  Patient is currently not interested in cortisone injections.  Due to the prior osteotomy of the tibia we would need CT scan for surgical planning.  We will make referral to [facility] weight loss clinic to help her achieve an appropriate BMI for surgery.  Goal weight is around 210 pounds.  Total knee packet recommendation provided.  Follow-Up Instructions: No follow-ups on file.   Orders:  No orders of the defined types were placed in this encounter.  No orders of the defined types were placed in this encounter.     Procedures: No procedures performed   Clinical Data: No additional findings.   Subjective: Chief Complaint  Patient presents with   Left Knee - Pain    HPI Jodi Chaney is a very pleasant 52 year old female here for evaluation of end-stage left knee DJD.  Referral from Dr. Arther Abbott in [place] for surgical consultation.  Patient underwent some sort of proximal tibial osteotomy when she was a child.  Recently she has been having problems with the knee and x-rays and MRI scans were conclusive for advanced DJD.  Review of Systems  Constitutional: Negative.   HENT: Negative.    Eyes: Negative.   Respiratory: Negative.    Cardiovascular: Negative.   Endocrine: Negative.   Musculoskeletal: Negative.   Neurological: Negative.   Hematological: Negative.   Psychiatric/Behavioral: Negative.    All other systems reviewed and are  negative.    Objective: Vital Signs: There were no vitals taken for this visit.  Physical Exam Vitals and nursing note reviewed.  Constitutional:      Appearance: She is well-developed.  HENT:     Head: Atraumatic.     Nose: Nose normal.  Eyes:     Extraocular Movements: Extraocular movements intact.  Cardiovascular:     Pulses: Normal pulses.  Pulmonary:     Effort: Pulmonary effort is normal.  Abdominal:     Palpations: Abdomen is soft.  Musculoskeletal:     Cervical back: Neck supple.  Skin:    General: Skin is warm.     Capillary Refill: Capillary refill takes less than 2 seconds.  Neurological:     Mental Status: She is alert. Mental status is at baseline.  Psychiatric:        Behavior: Behavior normal.        Thought Content: Thought content normal.        Judgment: Judgment normal.     Ortho Exam Examination of the left lower extremity shows fully healed surgical scars.  Her collaterals are intact.  Range of motion is 0 to greater than 120 degrees with pain.  Medial joint line tenderness.  No joint effusion. Specialty Comments:  No specialty comments available.  Imaging: No results found.   PMFS History: Patient Active Problem List  Diagnosis Date Noted   Perimenopause 09/10/2021   Encounter for screening fecal occult blood testing 08/12/2020   Encounter for well woman exam with routine gynecological exam 08/12/2020   Weight gain 08/12/2020   Weight loss counseling, encounter for 08/12/2020   BMI 45.0-49.9, adult ([place]) 08/12/2020   Microcytic anemia 04/17/2020   Obesity 09/15/2019   Severe obesity (BMI >= 40) ([place]) 09/15/2019   Encounter for gynecological examination with Papanicolaou smear of cervix 10/05/2018   Screening for colorectal cancer 10/05/2018   Positive fecal occult blood test 10/05/2018   Thickened endometrium 10/04/2014   Fibroids 10/04/2014   Endometrial polyp 10/04/2014   Anemia 08/24/2014   Menorrhagia 08/24/2014   Past  Medical History:  Diagnosis Date   Anemia    Endometrial polyp 10/04/2014   Fibroids 10/04/2014   Menorrhagia 08/24/2014   Thickened endometrium 10/04/2014   ?polyp to see Dr Elonda Husky about D&C and ablation    Family History  Problem Relation Age of Onset   Diabetes Mother    Hypertension Father    Other Father        has had open heart surgery; has one kidney   Stroke Father    Hypertension Sister    Diabetes Brother    Anxiety disorder Daughter    Bipolar disorder Son    Schizophrenia Son    Diabetes Maternal Grandmother    Kidney disease Maternal Grandfather    Cancer Paternal Grandmother        breast   Cancer Sister        breast   Diabetes Brother        borderline   Seizures Son    Colon cancer Neg Hx     Past Surgical History:  Procedure Laterality Date   CESAREAN SECTION     x3   COLONOSCOPY WITH PROPOFOL N/A 12/07/2019   Procedure: COLONOSCOPY WITH PROPOFOL;  Surgeon: Daneil Dolin, MD;  [facility];  Service: Endoscopy;  Laterality: N/A;  12:15pm   DILITATION & CURRETTAGE/HYSTROSCOPY WITH NOVASURE ABLATION N/A 10/24/2014   Procedure: HYSTEROSCOPY WITH NOVASURE ENDOMETRIAL ABLATION;  Surgeon: Florian Buff, MD;  Location: [facility];  Service: Gynecology;  Laterality: N/A;  Uterine Cavity Length 5.5cm Uterine Cavity Width 4.5cm power = 136 watts time = 55 seconds   KNEE SURGERY Left    removal of bone-from being bow-legged- done X2.   POLYPECTOMY  12/07/2019   Procedure: POLYPECTOMY;  Surgeon: Daneil Dolin, MD;  [facility];  Service: Endoscopy;;  cold snare Sigmoid polyp   TUBAL LIGATION     WISDOM TOOTH EXTRACTION     Social History   Occupational History   Not on file  Tobacco Use   Smoking status: Former    Packs/day: 0.25    Years: 10.00    Total pack years: 2.50    Types: Cigarettes    Quit date: 10/22/2007    Years since quitting: 14.9   Smokeless tobacco: Never  Vaping Use   Vaping Use: Never used  Substance and Sexual  Activity   Alcohol use: Not Currently    Comment: occ   Drug use: No   Sexual activity: Not Currently    Birth control/protection: None, Surgical    Comment: tubal

## 2022-09-29 NOTE — Addendum Note (Signed)
Addended by: Lendon Collar on: 09/29/2022 11:18 AM   Modules accepted: Orders

## 2022-10-07 ENCOUNTER — Encounter (HOSPITAL_COMMUNITY): Payer: Self-pay | Admitting: Hematology

## 2022-10-21 ENCOUNTER — Telehealth: Payer: Self-pay | Admitting: Orthopaedic Surgery

## 2022-10-21 NOTE — Telephone Encounter (Signed)
Spoke with patient she advised she is still in a great deal of pain and the pharmacy told her the Tylenol 3 is not in stock and do not know when it will be back in stock.   Patient asked if she can get something called into the pharmacy for pain. The number to contact patient is [phone]

## 2022-10-22 ENCOUNTER — Other Ambulatory Visit: Payer: Self-pay | Admitting: Physician Assistant

## 2022-10-22 MED ORDER — TRAMADOL HCL 50 MG PO TABS: 50.0000 mg | ORAL_TABLET | Freq: Three times a day (TID) | ORAL | 2 refills | Status: DC | PRN

## 2022-10-22 NOTE — Telephone Encounter (Signed)
Called and notified patient.

## 2022-10-22 NOTE — Telephone Encounter (Signed)
Sent in tramadol

## 2022-10-27 ENCOUNTER — Other Ambulatory Visit: Payer: BC Managed Care – PPO

## 2022-11-05 ENCOUNTER — Encounter: Payer: Self-pay | Admitting: Radiology

## 2023-01-20 ENCOUNTER — Encounter (INDEPENDENT_AMBULATORY_CARE_PROVIDER_SITE_OTHER): Payer: BC Managed Care – PPO | Admitting: Family Medicine

## 2023-08-01 NOTE — Progress Notes (Signed)
VIRTUAL VISIT via TELEPHONE NOTE [facility]   I connected with Jodi Chaney  on 08/02/23 at  11:20 AM by telephone and verified that I am speaking with the correct person using two identifiers.  Location: Patient: Home Provider: [facility]   I discussed the limitations, risks, security and privacy concerns of performing an evaluation and management service by telephone and the availability of in person appointments. I also discussed with the patient that there may be a patient responsible charge related to this service. The patient expressed understanding and agreed to proceed.  REASON FOR VISIT:  Follow-up for iron deficiency anemia  PRIOR THERAPY: Intermittent IV iron (Feraheme in September 2021)  CURRENT THERAPY: Daily iron supplement (ferrous gluconate)  INTERVAL HISTORY:   Jodi Chaney 52 y.o. female returns to reestablish care for her microcytic iron deficiency anemia.  She was last seen by NP Durenda Hurt on 11/26/2020, but failed to follow-up after this appointment.  She returns today at the request of her PCP after labs from 07/09/2023 showed Hgb 8.5 and iron saturation 3%.  At today's visit, patient reports feeling fatigued and sluggish.  She also reports significant stress in life related to intense work schedule (11 to 12-hour days 7 days of the week) and having her adult children and grandchildren living with her.  For the most part, she has continued to take iron supplement over the past year, but has not been taking it for the past few weeks after misplacing her iron pills.  She denies any rectal bleeding or melena.  She reports moderately heavy menstrual cycles on a monthly basis.  She is symptomatic with fatigue and ice pica.  She denies any headaches, lightheadedness, syncope, chest pain, or dyspnea on exertion.  She has 25% energy and little to no appetite. She endorses that she is maintaining a stable weight.   REVIEW OF  SYSTEMS:   Review of Systems  Constitutional:  Positive for malaise/fatigue. Negative for chills, diaphoresis, fever and weight loss.  Respiratory:  Negative for cough and shortness of breath.   Cardiovascular:  Negative for chest pain and palpitations.  Gastrointestinal:  Negative for abdominal pain, blood in stool, melena, nausea and vomiting.  Neurological:  Negative for dizziness and headaches.     PHYSICAL EXAM: (per limitations of virtual telephone visit)  The patient is alert and oriented x 3, exhibiting adequate mentation, good mood, and ability to speak in full sentences and execute sound judgement.   ASSESSMENT & PLAN:  1.  Severe microcytic anemia / iron deficiency anemia - Labs from July 2021 showed Hgb 8.7/MCV 74 with ferritin 6, iron saturation 4% - Colonoscopy on 12/07/2019 showed 5 mm polyp in the sigmoid colon and normal rest of the colon. - Received 3 units PRBC transfusion in 1991 during pregnancy - Received IV Feraheme 2015, and again in August/September 2021 - She has been taking oral iron (ferrous gluconate 324 mg) daily for the past year, with the exception of the past 2 weeks - No rectal bleeding or melena.  Reports heavy menstrual cycles.  - Symptomatic with fatigue and ice pica - Most recent labs from PCP (07/09/2023) show Hgb 8.5 and iron saturation 3% - PLAN: Recommend IV Venofer 400 mg x 3 doses - Restart daily iron supplement - Same day labs (CBC/D, ferritin, iron/TIBC) with office visit in 2 months - Continue follow-up with GYN for breakthrough vaginal bleeding s/p ablation  2.  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 - Quit smoking 36 years ago and smoked 3 to 4 cigarettes for 10 years. - Sister died of breast cancer at age 45. Father had kidney cancer. Paternal grandmother died of breast cancer.  PLAN SUMMARY: >> IV Venofer 400 mg x 3 >> Same-day labs (CBC/D, ferritin, iron/TIBC) + OFFICE visit in 2  months     I discussed the assessment and treatment plan with the patient. The patient was provided an opportunity to ask questions and all were answered. The patient agreed with the plan and demonstrated an understanding of the instructions.   The patient was advised to call back or seek an in-person evaluation if the symptoms worsen or if the condition fails to improve as anticipated.  I provided 22 minutes of non-face-to-face time during this encounter.  Carnella Guadalajara, PA-C 08/02/2023 11:42 AM

## 2023-08-02 ENCOUNTER — Inpatient Hospital Stay: Payer: BC Managed Care – PPO | Attending: Physician Assistant | Admitting: Physician Assistant

## 2023-08-02 ENCOUNTER — Other Ambulatory Visit: Payer: Self-pay | Admitting: *Deleted

## 2023-08-02 ENCOUNTER — Encounter: Payer: Self-pay | Admitting: Physician Assistant

## 2023-08-02 DIAGNOSIS — D509 Iron deficiency anemia, unspecified: Secondary | ICD-10-CM

## 2023-08-02 DIAGNOSIS — D500 Iron deficiency anemia secondary to blood loss (chronic): Secondary | ICD-10-CM

## 2023-08-02 HISTORY — DX: Iron deficiency anemia secondary to blood loss (chronic): D50.0

## 2023-08-02 MED ORDER — FERROUS GLUCONATE 324 (38 FE) MG PO TABS: 324.0000 mg | ORAL_TABLET | Freq: Every day | ORAL | 3 refills | Status: DC

## 2023-08-12 MED FILL — Iron Sucrose Inj 20 MG/ML (Fe Equiv): INTRAVENOUS | Qty: 20 | Status: AC

## 2023-08-13 ENCOUNTER — Inpatient Hospital Stay: Payer: BC Managed Care – PPO

## 2023-08-20 ENCOUNTER — Inpatient Hospital Stay: Payer: BC Managed Care – PPO | Attending: Hematology

## 2023-08-20 VITALS — BP 133/70 | HR 66 | Temp 98.2°F | Resp 18 | Ht 60.0 in | Wt 231.0 lb

## 2023-08-20 DIAGNOSIS — D509 Iron deficiency anemia, unspecified: Secondary | ICD-10-CM | POA: Insufficient documentation

## 2023-08-20 DIAGNOSIS — D500 Iron deficiency anemia secondary to blood loss (chronic): Secondary | ICD-10-CM

## 2023-08-20 MED ORDER — CETIRIZINE HCL 10 MG PO TABS
10.0000 mg | ORAL_TABLET | Freq: Once | ORAL | Status: AC
  Administered 2023-08-20: 10 mg via ORAL
  Filled 2023-08-20: qty 1

## 2023-08-20 MED ORDER — SODIUM CHLORIDE 0.9 % IV SOLN
400.0000 mg | Freq: Once | INTRAVENOUS | Status: AC
  Administered 2023-08-20: 400 mg via INTRAVENOUS
  Filled 2023-08-20: qty 400

## 2023-08-20 MED ORDER — ACETAMINOPHEN 325 MG PO TABS
650.0000 mg | ORAL_TABLET | Freq: Once | ORAL | Status: AC
  Administered 2023-08-20: 650 mg via ORAL
  Filled 2023-08-20: qty 2

## 2023-08-20 MED ORDER — SODIUM CHLORIDE 0.9 % IV SOLN: INTRAVENOUS | Status: DC

## 2023-08-20 MED ORDER — SODIUM CHLORIDE 0.9% FLUSH
10.0000 mL | Freq: Two times a day (BID) | INTRAVENOUS | Status: DC
Start: 2023-08-20 — End: 2023-08-20

## 2023-08-20 NOTE — Patient Instructions (Signed)
Iron Sucrose Injection What is this medication? IRON SUCROSE (EYE ern SOO krose) treats low levels of iron (iron deficiency anemia) in people with kidney disease. Iron is a mineral that plays an important role in making red blood cells, which carry oxygen from your lungs to the rest of your body. This medicine may be used for other purposes; ask your health care provider or pharmacist if you have questions. COMMON BRAND NAME(S): Venofer What should I tell my care team before I take this medication? They need to know if you have any of these conditions: Anemia not caused by low iron levels Heart disease High levels of iron in the blood Kidney disease Liver disease An unusual or allergic reaction to iron, other medications, foods, dyes, or preservatives Pregnant or trying to get pregnant Breastfeeding How should I use this medication? This medication is for infusion into a vein. It is given in a hospital or clinic setting. Talk to your care team about the use of this medication in children. While this medication may be prescribed for children as young as 2 years for selected conditions, precautions do apply. Overdosage: If you think you have taken too much of this medicine contact a poison control center or emergency room at once. NOTE: This medicine is only for you. Do not share this medicine with others. What if I miss a dose? Keep appointments for follow-up doses. It is important not to miss your dose. Call your care team if you are unable to keep an appointment. What may interact with this medication? Do not take this medication with any of the following: Deferoxamine Dimercaprol Other iron products This medication may also interact with the following: Chloramphenicol Deferasirox This list may not describe all possible interactions. Give your health care provider a list of all the medicines, herbs, non-prescription drugs, or dietary supplements you use. Also tell them if you smoke,  drink alcohol, or use illegal drugs. Some items may interact with your medicine. What should I watch for while using this medication? Visit your care team regularly. Tell your care team if your symptoms do not start to get better or if they get worse. You may need blood work done while you are taking this medication. You may need to follow a special diet. Talk to your care team. Foods that contain iron include: whole grains/cereals, dried fruits, beans, or peas, leafy green vegetables, and organ meats (liver, kidney). What side effects may I notice from receiving this medication? Side effects that you should report to your care team as soon as possible: Allergic reactions--skin rash, itching, hives, swelling of the face, lips, tongue, or throat Low blood pressure--dizziness, feeling faint or lightheaded, blurry vision Shortness of breath Side effects that usually do not require medical attention (report to your care team if they continue or are bothersome): Flushing Headache Joint pain Muscle pain Nausea Pain, redness, or irritation at injection site This list may not describe all possible side effects. Call your doctor for medical advice about side effects. You may report side effects to FDA at 1-800-FDA-1088. Where should I keep my medication? This medication is given in a hospital or clinic. It will not be stored at home. NOTE: This sheet is a summary. It may not cover all possible information. If you have questions about this medicine, talk to your doctor, pharmacist, or health care provider.  2024 Elsevier/Gold Standard (2023-01-29 00:00:00)

## 2023-08-20 NOTE — Progress Notes (Signed)
Patient presents today for Venofer 400 mg. Vital signs stable. Patient has no complaints of any side effects related to her last iron infusions. Patient received Feraheme in 2021. First Venofer infusion today.   Venofer given today per MD orders. Tolerated infusion without adverse affects. Vital signs stable. No complaints at this time. Discharged from clinic ambulatory in stable condition. Alert and oriented x 3. F/U with [facility] as scheduled.

## 2023-08-27 ENCOUNTER — Inpatient Hospital Stay: Payer: BC Managed Care – PPO

## 2023-08-30 ENCOUNTER — Inpatient Hospital Stay: Payer: BC Managed Care – PPO

## 2023-09-07 ENCOUNTER — Inpatient Hospital Stay: Payer: BC Managed Care – PPO

## 2023-09-10 ENCOUNTER — Inpatient Hospital Stay: Payer: BC Managed Care – PPO | Attending: Hematology

## 2023-09-10 VITALS — BP 123/61 | HR 70 | Temp 97.6°F | Resp 18

## 2023-09-10 DIAGNOSIS — D509 Iron deficiency anemia, unspecified: Secondary | ICD-10-CM | POA: Diagnosis present

## 2023-09-10 DIAGNOSIS — D500 Iron deficiency anemia secondary to blood loss (chronic): Secondary | ICD-10-CM

## 2023-09-10 MED ORDER — SODIUM CHLORIDE 0.9 % IV SOLN
400.0000 mg | Freq: Once | INTRAVENOUS | Status: AC
  Administered 2023-09-10: 400 mg via INTRAVENOUS
  Filled 2023-09-10: qty 400

## 2023-09-10 MED ORDER — ONDANSETRON HCL 4 MG/2ML IJ SOLN
8.0000 mg | Freq: Once | INTRAMUSCULAR | Status: AC
Start: 2023-09-10 — End: 2023-09-10
  Administered 2023-09-10: 8 mg via INTRAVENOUS
  Filled 2023-09-10: qty 4

## 2023-09-10 MED ORDER — CETIRIZINE HCL 10 MG PO TABS
10.0000 mg | ORAL_TABLET | Freq: Once | ORAL | Status: AC
  Administered 2023-09-10: 10 mg via ORAL
  Filled 2023-09-10: qty 1

## 2023-09-10 MED ORDER — ACETAMINOPHEN 325 MG PO TABS
650.0000 mg | ORAL_TABLET | Freq: Once | ORAL | Status: AC
  Administered 2023-09-10: 650 mg via ORAL
  Filled 2023-09-10: qty 2

## 2023-09-10 MED ORDER — SODIUM CHLORIDE 0.9 % IV SOLN
INTRAVENOUS | Status: DC
Start: 2023-09-10 — End: 2023-09-10

## 2023-09-10 MED ORDER — SODIUM CHLORIDE 0.9% FLUSH
10.0000 mL | Freq: Two times a day (BID) | INTRAVENOUS | Status: DC
Start: 2023-09-10 — End: 2023-09-10

## 2023-09-10 NOTE — Progress Notes (Signed)
Venofer given today per MD orders. Tolerated infusion without adverse affects. Vital signs stable. No complaints at this time. Discharged from clinic ambulatory in stable condition. Alert and oriented x 3. F/U with [facility] as scheduled.

## 2023-09-10 NOTE — Patient Instructions (Signed)
 [facility] [place] - A DEPT OF [facility]  Discharge Instructions: Thank you for choosing  Cancer Center to provide your oncology and hematology care.  If you have a lab appointment with the Cancer Center - please note that after April 8th, 2024, all labs will be drawn in the cancer center.  You do not have to check in or register with the main entrance as you have in the past but will complete your check-in in the cancer center.  Wear comfortable clothing and clothing appropriate for easy access to any Portacath or PICC line.   We strive to give you quality time with your provider. You may need to reschedule your appointment if you arrive late (15 or more minutes).  Arriving late affects you and other patients whose appointments are after yours.  Also, if you miss three or more appointments without notifying the office, you may be dismissed from the clinic at the provider's discretion.      For prescription refill requests, have your pharmacy contact our office and allow 72 hours for refills to be completed.    Today you received the following chemotherapy and/or immunotherapy agents Venofer .Iron Sucrose Injection What is this medication? IRON SUCROSE (EYE ern SOO krose) treats low levels of iron (iron deficiency anemia) in people with kidney disease. Iron is a mineral that plays an important role in making red blood cells, which carry oxygen from your lungs to the rest of your body. This medicine may be used for other purposes; ask your health care provider or pharmacist if you have questions. COMMON BRAND NAME(S): Venofer What should I tell my care team before I take this medication? They need to know if you have any of these conditions: Anemia not caused by low iron levels Heart disease High levels of iron in the blood Kidney disease Liver disease An unusual or allergic reaction to iron, other medications, foods, dyes, or preservatives Pregnant or  trying to get pregnant Breastfeeding How should I use this medication? This medication is for infusion into a vein. It is given in a hospital or clinic setting. Talk to your care team about the use of this medication in children. While this medication may be prescribed for children as young as 2 years for selected conditions, precautions do apply. Overdosage: If you think you have taken too much of this medicine contact a poison control center or emergency room at once. NOTE: This medicine is only for you. Do not share this medicine with others. What if I miss a dose? Keep appointments for follow-up doses. It is important not to miss your dose. Call your care team if you are unable to keep an appointment. What may interact with this medication? Do not take this medication with any of the following: Deferoxamine Dimercaprol Other iron products This medication may also interact with the following: Chloramphenicol Deferasirox This list may not describe all possible interactions. Give your health care provider a list of all the medicines, herbs, non-prescription drugs, or dietary supplements you use. Also tell them if you smoke, drink alcohol, or use illegal drugs. Some items may interact with your medicine. What should I watch for while using this medication? Visit your care team regularly. Tell your care team if your symptoms do not start to get better or if they get worse. You may need blood work done while you are taking this medication. You may need to follow a special diet. Talk to your care  team. Foods that contain iron include: whole grains/cereals, dried fruits, beans, or peas, leafy green vegetables, and organ meats (liver, kidney). What side effects may I notice from receiving this medication? Side effects that you should report to your care team as soon as possible: Allergic reactions--skin rash, itching, hives, swelling of the face, lips, tongue, or throat Low blood  pressure--dizziness, feeling faint or lightheaded, blurry vision Shortness of breath Side effects that usually do not require medical attention (report to your care team if they continue or are bothersome): Flushing Headache Joint pain Muscle pain Nausea Pain, redness, or irritation at injection site This list may not describe all possible side effects. Call your doctor for medical advice about side effects. You may report side effects to FDA at 1-800-FDA-1088. Where should I keep my medication? This medication is given in a hospital or clinic. It will not be stored at home. NOTE: This sheet is a summary. It may not cover all possible information. If you have questions about this medicine, talk to your doctor, pharmacist, or health care provider.  2024 Elsevier/Gold Standard (2023-01-29 00:00:00)       To help prevent nausea and vomiting after your treatment, we encourage you to take your nausea medication as directed.  BELOW ARE SYMPTOMS THAT SHOULD BE REPORTED IMMEDIATELY: *FEVER GREATER THAN 100.4 F (38 C) OR HIGHER *CHILLS OR SWEATING *NAUSEA AND VOMITING THAT IS NOT CONTROLLED WITH YOUR NAUSEA MEDICATION *UNUSUAL SHORTNESS OF BREATH *UNUSUAL BRUISING OR BLEEDING *URINARY PROBLEMS (pain or burning when urinating, or frequent urination) *BOWEL PROBLEMS (unusual diarrhea, constipation, pain near the anus) TENDERNESS IN MOUTH AND THROAT WITH OR WITHOUT PRESENCE OF ULCERS (sore throat, sores in mouth, or a toothache) UNUSUAL RASH, SWELLING OR PAIN  UNUSUAL VAGINAL DISCHARGE OR ITCHING   Items with * indicate a potential emergency and should be followed up as soon as possible or go to the Emergency Department if any problems should occur.  Please show the CHEMOTHERAPY ALERT CARD or IMMUNOTHERAPY ALERT CARD at check-in to the Emergency Department and triage nurse.  Should you have questions after your visit or need to cancel or reschedule your appointment, please contact [facility] CANCER  CTR [place] - A DEPT OF Eligha Bridegroom [facility] [phone]  and follow the prompts.  Office hours are 8:00 a.m. to 4:30 p.m. Monday - Friday. Please note that voicemails left after 4:00 p.m. may not be returned until the following business day.  We are closed weekends and major holidays. You have access to a nurse at all times for urgent questions. Please call the main number to the clinic [phone] and follow the prompts.  For any non-urgent questions, you may also contact your provider using MyChart. We now offer e-Visits for anyone 68 and older to request care online for non-urgent symptoms. For details visit [email or web address].   Also download the MyChart app! Go to the app store, search "MyChart", open the app, select [place], and log in with your MyChart username and password.

## 2023-09-17 ENCOUNTER — Other Ambulatory Visit: Payer: Self-pay

## 2023-09-17 ENCOUNTER — Inpatient Hospital Stay: Payer: BC Managed Care – PPO

## 2023-09-17 VITALS — BP 97/54 | HR 77 | Temp 98.5°F | Resp 18 | Wt 229.0 lb

## 2023-09-17 DIAGNOSIS — D509 Iron deficiency anemia, unspecified: Secondary | ICD-10-CM | POA: Diagnosis not present

## 2023-09-17 DIAGNOSIS — D500 Iron deficiency anemia secondary to blood loss (chronic): Secondary | ICD-10-CM

## 2023-09-17 MED ORDER — ONDANSETRON HCL 4 MG/2ML IJ SOLN
8.0000 mg | Freq: Once | INTRAMUSCULAR | Status: AC
  Administered 2023-09-17: 8 mg via INTRAVENOUS
  Filled 2023-09-17: qty 4

## 2023-09-17 MED ORDER — FERROUS GLUCONATE 324 (38 FE) MG PO TABS: 324.0000 mg | ORAL_TABLET | Freq: Every day | ORAL | 3 refills | Status: AC

## 2023-09-17 MED ORDER — SODIUM CHLORIDE 0.9 % IV SOLN: INTRAVENOUS | Status: DC

## 2023-09-17 MED ORDER — ACETAMINOPHEN 325 MG PO TABS
650.0000 mg | ORAL_TABLET | Freq: Once | ORAL | Status: AC
Start: 2023-09-17 — End: 2023-09-17
  Administered 2023-09-17: 650 mg via ORAL
  Filled 2023-09-17: qty 2

## 2023-09-17 MED ORDER — CETIRIZINE HCL 10 MG PO TABS
10.0000 mg | ORAL_TABLET | Freq: Once | ORAL | Status: AC
Start: 2023-09-17 — End: 2023-09-17
  Administered 2023-09-17: 10 mg via ORAL
  Filled 2023-09-17: qty 1

## 2023-09-17 MED ORDER — SODIUM CHLORIDE 0.9% FLUSH
10.0000 mL | Freq: Two times a day (BID) | INTRAVENOUS | Status: DC
Start: 2023-09-17 — End: 2023-09-17

## 2023-09-17 MED ORDER — SODIUM CHLORIDE 0.9 % IV SOLN
400.0000 mg | Freq: Once | INTRAVENOUS | Status: AC
  Administered 2023-09-17: 400 mg via INTRAVENOUS
  Filled 2023-09-17: qty 400

## 2023-09-17 NOTE — Progress Notes (Signed)
 Patient presents today for Venofer 400 mg infusion. Patient denies any side effects related to her last iron infusion. Vital signs within parameters for treatment. Fergon refilled per patient request.

## 2023-09-17 NOTE — Progress Notes (Signed)
Venofer iron infusion given per orders. Patient tolerated it well without problems. Vitals stable and discharged home from clinic ambulatory. Follow up as scheduled.

## 2023-09-17 NOTE — Patient Instructions (Signed)
 [facility] [place] - A DEPT OF [facility]  Discharge Instructions: Thank you for choosing [facility] to provide your oncology and hematology care.  If you have a lab appointment with the Cancer Center - please note that after April 8th, 2024, all labs will be drawn in the cancer center.  You do not have to check in or register with the main entrance as you have in the past but will complete your check-in in the cancer center.  Wear comfortable clothing and clothing appropriate for easy access to any Portacath or PICC line.   We strive to give you quality time with your provider. You may need to reschedule your appointment if you arrive late (15 or more minutes).  Arriving late affects you and other patients whose appointments are after yours.  Also, if you miss three or more appointments without notifying the office, you may be dismissed from the clinic at the provider's discretion.      For prescription refill requests, have your pharmacy contact our office and allow 72 hours for refills to be completed.    Today you received the following venofer iron infusion   To help prevent nausea and vomiting after your treatment, we encourage you to take your nausea medication as directed.  BELOW ARE SYMPTOMS THAT SHOULD BE REPORTED IMMEDIATELY: *FEVER GREATER THAN 100.4 F (38 C) OR HIGHER *CHILLS OR SWEATING *NAUSEA AND VOMITING THAT IS NOT CONTROLLED WITH YOUR NAUSEA MEDICATION *UNUSUAL SHORTNESS OF BREATH *UNUSUAL BRUISING OR BLEEDING *URINARY PROBLEMS (pain or burning when urinating, or frequent urination) *BOWEL PROBLEMS (unusual diarrhea, constipation, pain near the anus) TENDERNESS IN MOUTH AND THROAT WITH OR WITHOUT PRESENCE OF ULCERS (sore throat, sores in mouth, or a toothache) UNUSUAL RASH, SWELLING OR PAIN  UNUSUAL VAGINAL DISCHARGE OR ITCHING   Items with * indicate a potential emergency and should be followed up as soon as possible or go to  the Emergency Department if any problems should occur.  Please show the CHEMOTHERAPY ALERT CARD or IMMUNOTHERAPY ALERT CARD at check-in to the Emergency Department and triage nurse.  Should you have questions after your visit or need to cancel or reschedule your appointment, please contact [facility] CANCER CTR [place] - A DEPT OF [facility]a Bridegroom [facility] [phone]  and follow the prompts.  Office hours are 8:00 a.m. to 4:30 p.m. Monday - Friday. Please note that voicemails left after 4:00 p.m. may not be returned until the following business day.  We are closed weekends and major holidays. You have access to a nurse at all times for urgent questions. Please call the main number to the clinic [phone] and follow the prompts.  For any non-urgent questions, you may also contact your provider using MyChart. We now offer e-Visits for anyone 37 and older to request care online for non-urgent symptoms. For details visit [email or web address].   Also download the MyChart app! Go to the app store, search "MyChart", open the app, select [place], and log in with your MyChart username and password.

## 2023-10-01 IMAGING — MG MM DIGITAL SCREENING BILAT W/ TOMO AND CAD
8 series · 8 of 24 positions shown · non-contrast
Comparison: Previous exam(s).

CLINICAL DATA: Screening.

EXAM:
DIGITAL SCREENING BILATERAL MAMMOGRAM WITH TOMOSYNTHESIS AND CAD
TECHNIQUE: Bilateral screening digital craniocaudal and mediolateral oblique
mammograms were obtained. Bilateral screening digital breast
tomosynthesis was performed. The images were evaluated with
computer-aided detection.

[L MLO synth-2D]
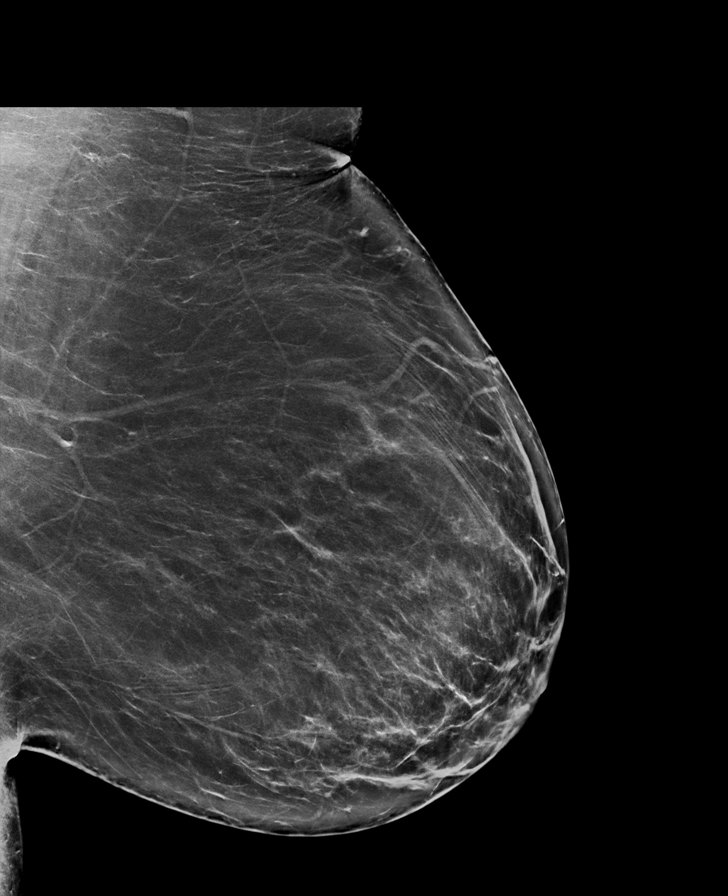

[R CC synth-2D]
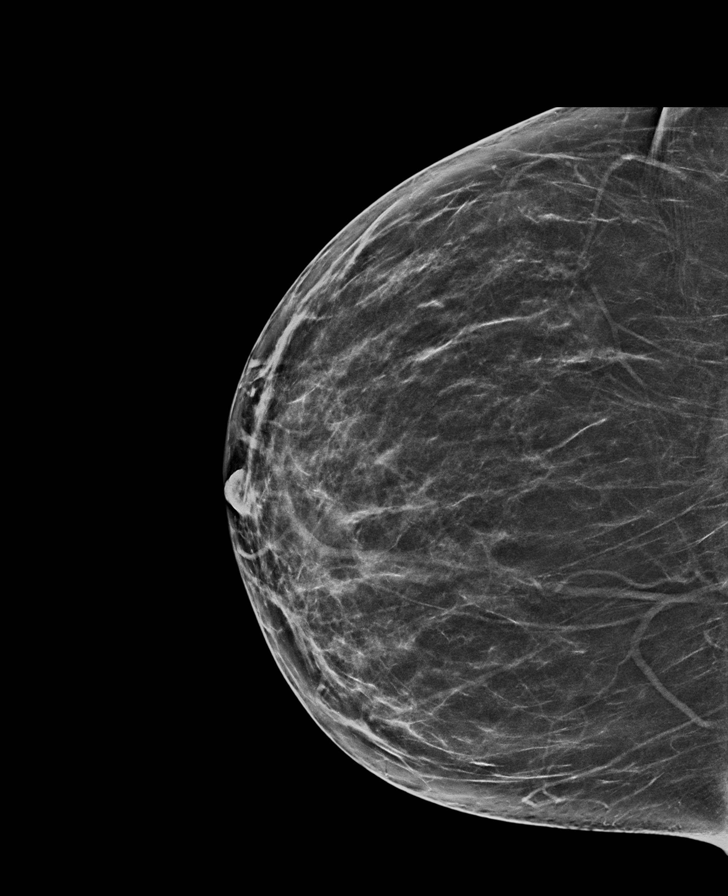

[L CC synth-2D]
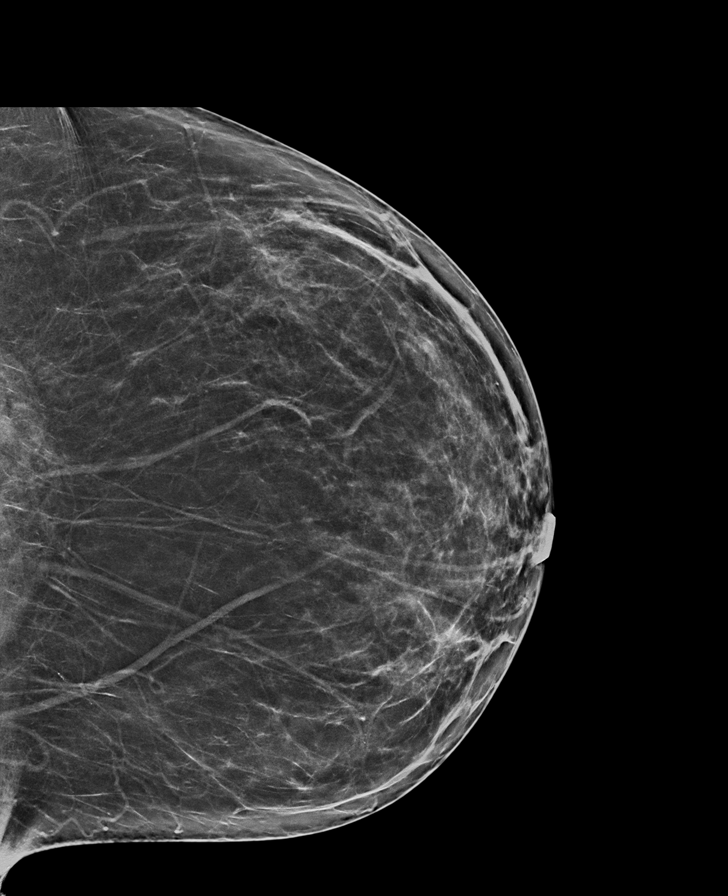

[R MLO synth-2D]
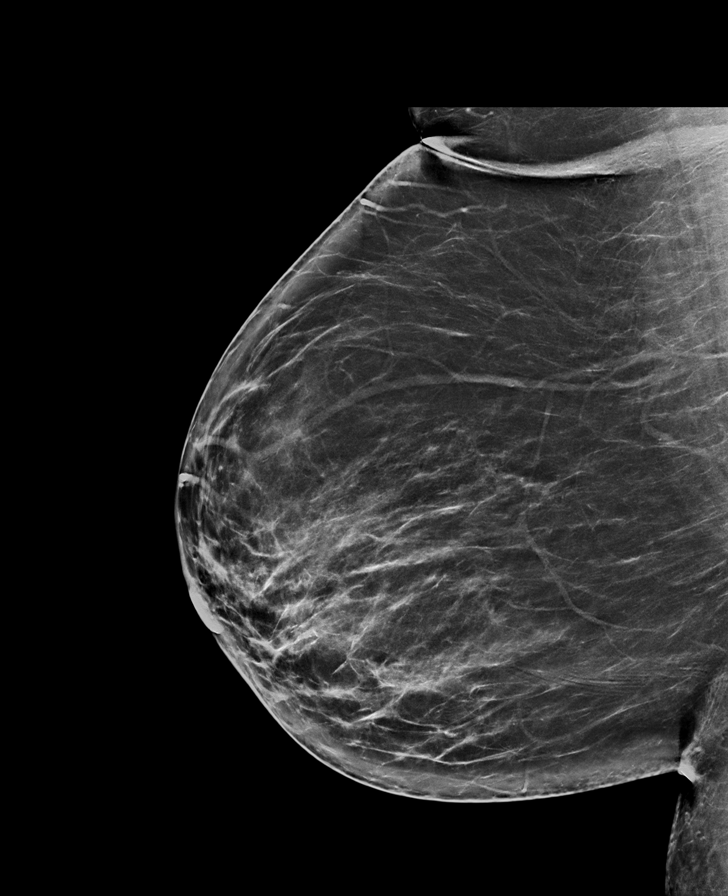

[L CC tomo · tomo slice 38/75.0]
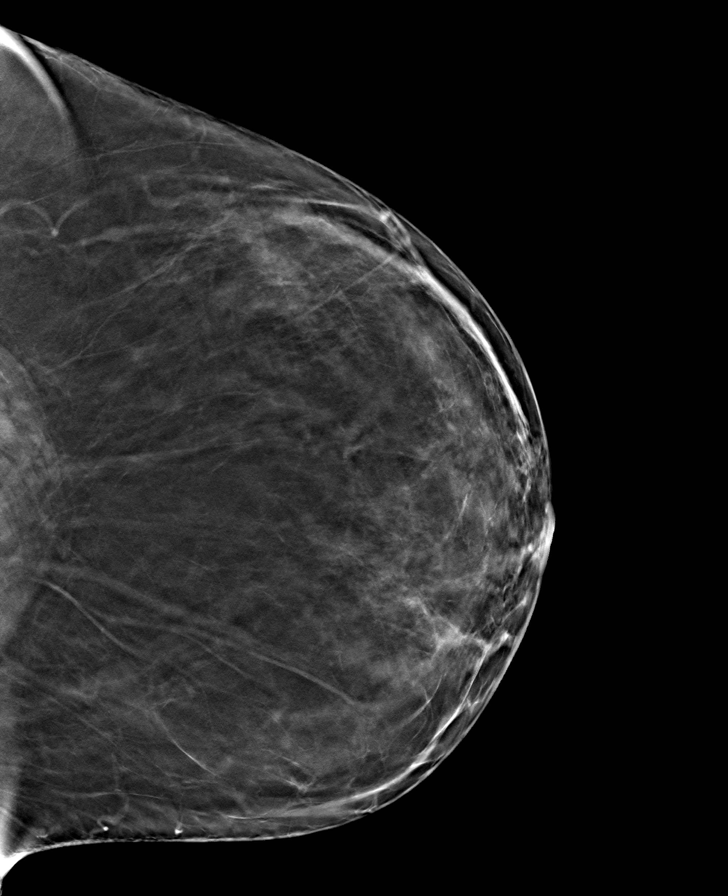

[R CC tomo · tomo slice 36/71.0]
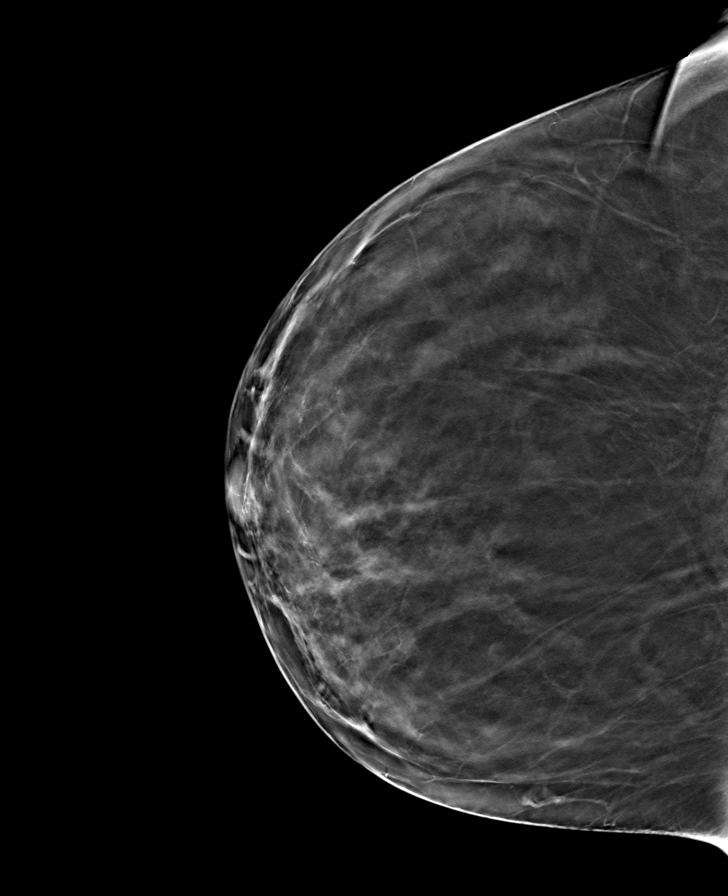

[L MLO tomo · tomo slice 47/92.0]
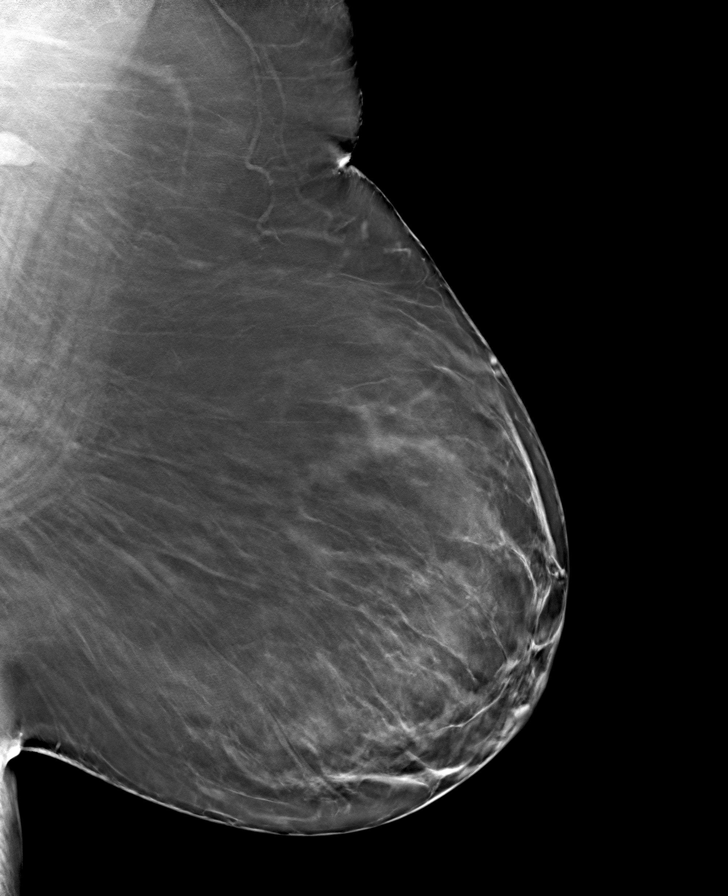

[R MLO tomo · tomo slice 43/84.0]
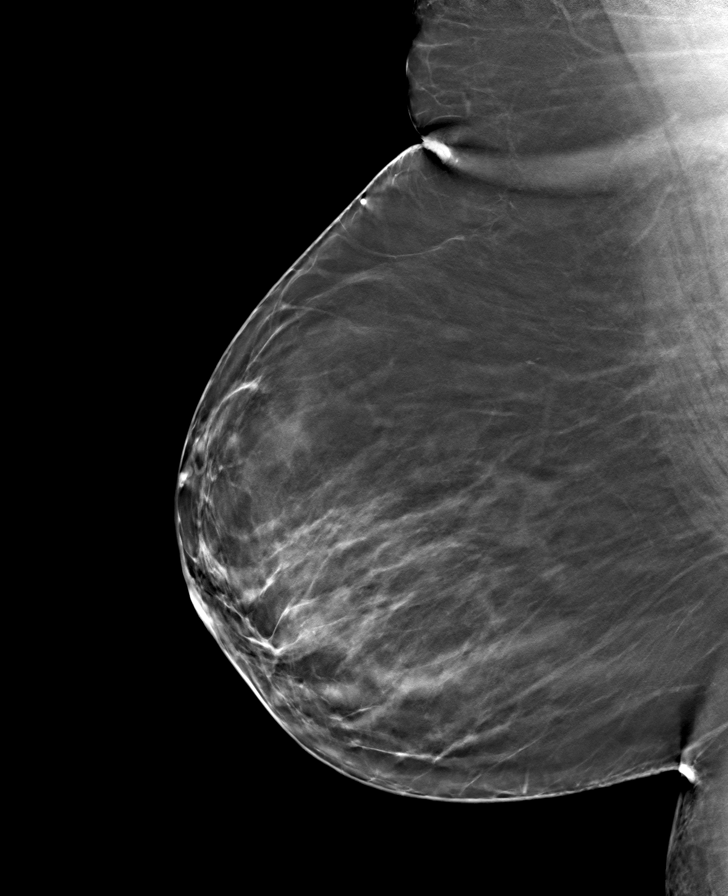

[8 of 24 positions shown; findings below may reference images not displayed]

ACR Breast Density Category b: There are scattered areas of
fibroglandular density.
FINDINGS: There are no findings suspicious for malignancy.
IMPRESSION: No mammographic evidence of malignancy. A result letter of this
screening mammogram will be mailed directly to the patient.

RECOMMENDATION:
Screening mammogram in one year. (Code:51-O-LD2)

BI-RADS CATEGORY  1: Negative.

## 2023-10-04 ENCOUNTER — Other Ambulatory Visit (HOSPITAL_COMMUNITY): Payer: Self-pay | Admitting: Adult Health

## 2023-10-04 DIAGNOSIS — Z1231 Encounter for screening mammogram for malignant neoplasm of breast: Secondary | ICD-10-CM

## 2023-10-04 NOTE — Progress Notes (Deleted)
 [facility] [address], [place] [address]   CLINIC:  Medical Oncology/Hematology  PCP:  Benita Stabile, MD [address] [place] [address] [phone]   REASON FOR VISIT:  Follow-up for iron deficiency anemia   CURRENT THERAPY: Intermittent IV iron + Daily iron supplement (ferrous gluconate)  INTERVAL HISTORY:   [name] 53 y.o. female returns for routine follow-up of microcytic iron deficiency anemia.  She was last evaluated via telemedicine visit by Rojelio Brenner PA-C on 08/02/2023.  She received Venofer 400 mg x 3 doses in December 2024/January 2025.  At today's visit, she reports feeling ***. *** Continues to report significant stress in life related to intense work schedule (11 to 12-hour days 7 days of the week) and having her adult children and grandchildren living with her. *** She felt *** after receiving her IV iron in December/January. *** She continues to take daily iron supplement (ferrous gluconate). ***She denies any rectal bleeding or melena. *** She reports moderately heavy menstrual cycles on a monthly basis. *** She is symptomatic with fatigue and ice pica. *** She denies any headaches, lightheadedness, syncope, chest pain, or dyspnea on exertion.   She has 25***% energy and little to no*** appetite. She endorses that she is maintaining a stable weight.   ASSESSMENT & PLAN:  1.  Severe microcytic anemia / iron deficiency anemia - Labs from July 2021 showed Hgb 8.7/MCV 74 with ferritin 6, iron saturation 4% - Colonoscopy on 12/07/2019 showed 5 mm polyp in the sigmoid colon and normal rest of the colon. - Received 3 units PRBC transfusion in 1991 during pregnancy - Most recent IV iron with Venofer 400 mg x 3 in December 2024/January 2025. - She continues to take oral iron (ferrous gluconate 324 mg) daily *** - No rectal bleeding or melena. *** Reports heavy menstrual cycles.  - Symptomatic with fatigue and ice  pica*** - Labs today (10/05/2023): *** - PLAN: *** IV iron?  *** - Restart daily iron supplement - *** Labs and RTC *** - Continue follow-up with GYN for breakthrough vaginal bleeding s/p ablation***   2.  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 - Quit smoking 36 years ago and smoked 3 to 4 cigarettes for 10 years. - Sister died of breast cancer at age 57. Father had kidney cancer. Paternal grandmother died of breast cancer.   PLAN SUMMARY: >> *** TBD ***     REVIEW OF SYSTEMS: ***  Review of Systems - Oncology   PHYSICAL EXAM:  ECOG PERFORMANCE STATUS: {CHL ONC ECOG GN:[id number]} *** There were no vitals filed for this visit. There were no vitals filed for this visit. Physical Exam  PAST MEDICAL/SURGICAL HISTORY:  Past Medical History:  Diagnosis Date   Anemia    Endometrial polyp 10/04/2014   Fibroids 10/04/2014   Iron deficiency anemia due to chronic blood loss 08/02/2023   Menorrhagia 08/24/2014   Thickened endometrium 10/04/2014   ?polyp to see Dr Despina Hidden about [facility] and ablation   Past Surgical History:  Procedure Laterality Date   CESAREAN SECTION     x3   COLONOSCOPY WITH PROPOFOL N/A 12/07/2019   Procedure: COLONOSCOPY WITH PROPOFOL;  Surgeon: Corbin Ade, MD;  Location: [facility] ENDO SU[facility];  Service: Endoscopy;  Laterality: N/A;  12:15pm   DILITATION & CURRETTAGE/HYSTROSCOPY WITH NOVASURE ABLATION N/A 10/24/2014   Procedure: HYSTEROSCOPY WITH NOVASURE ENDOMETRIAL ABLATION;  Surgeon: Lazaro Arms, MD;  Location: [facility] ORS;  Service: Gynecology;  Laterality: N/A;  Uterine Cavity Length 5.5cm Uterine Cavity Width 4.5cm power = 136 watts time = 55 seconds   KNEE SURGERY Left    removal of bone-from being bow-legged- done X2.   POLYPECTOMY  12/07/2019   Procedure: POLYPECTOMY;  Surgeon: Corbin Ade, MD;  Location: [facility] ENDO SUITE;  Service: Endoscopy;;  cold snare Sigmoid polyp   TUBAL LIGATION     WISDOM TOOTH  EXTRACTION      SOCIAL HISTORY:  Social History   Socioeconomic History   Marital status: Single    Spouse name: Not on file   Number of children: 3   Years of education: Not on file   Highest education level: Not on file  Occupational History   Not on file  Tobacco Use   Smoking status: Former    Current packs/day: 0.00    Average packs/day: 0.3 packs/day for 10.0 years (2.5 ttl pk-yrs)    Types: Cigarettes    Start date: 10/21/1997    Quit date: 10/22/2007    Years since quitting: 15.9   Smokeless tobacco: Never  Vaping Use   Vaping status: Never Used  Substance and Sexual Activity   Alcohol use: Not Currently    Comment: occ   Drug use: No   Sexual activity: Not Currently    Birth control/protection: None, Surgical    Comment: tubal  Other Topics Concern   Not on file  Social History Narrative   Not on file   Social Drivers of Health   Financial Resource Strain: Medium Risk (09/10/2021)   Overall Financial Resource Strain (CARDIA)    Difficulty of Paying Living Expenses: Somewhat hard  Food Insecurity: Food Insecurity Present (09/10/2021)   Hunger Vital Sign    Worried About Running Out of Food in the Last Year: Often true    Ran Out of Food in the Last Year: Often true  Transportation Needs: No Transportation Needs (09/10/2021)   PRAPARE - Administrator, Civil Service (Medical): No    Lack of Transportation (Non-Medical): No  Physical Activity: Insufficiently Active (09/10/2021)   Exercise Vital Sign    Days of Exercise per Week: 2 days    Minutes of Exercise per Session: 20 min  Stress: No Stress Concern Present (09/10/2021)   [employer] of Occupational Health - Occupational Stress Questionnaire    Feeling of Stress : Only a little  Social Connections: Socially Isolated (09/10/2021)   Social Connection and Isolation Panel [NHANES]    Frequency of Communication with Friends and Family: More than three times a week    Frequency of Social Gatherings  with Friends and Family: More than three times a week    Attends Religious Services: Never    Database administrator or Organizations: No    Attends Banker Meetings: Never    Marital Status: Never married  Intimate Partner Violence: Not At Risk (09/10/2021)   Humiliation, Afraid, Rape, and Kick questionnaire    Fear of Current or Ex-Partner: No    Emotionally Abused: No    Physically Abused: No    Sexually Abused: No    FAMILY HISTORY:  Family History  Problem Relation Age of Onset   Diabetes Mother    Hypertension Father    Other Father        has had open heart surgery; has one kidney   Stroke Father    Hypertension Sister    Diabetes Brother  Anxiety disorder Daughter    Bipolar disorder Son    Schizophrenia Son    Diabetes Maternal Grandmother    Kidney disease Maternal Grandfather    Cancer Paternal Grandmother        breast   Cancer Sister        breast   Diabetes Brother        borderline   Seizures Son    Colon cancer Neg Hx     CURRENT MEDICATIONS:  Outpatient Encounter Medications as of 10/05/2023  Medication Sig   cholecalciferol (VITAMIN D3) 25 MCG (1000 UNIT) tablet Take 1,000 Units by mouth daily.   ferrous gluconate (FERGON) 324 MG tablet Take 1 tablet (324 mg total) by mouth daily with breakfast.   furosemide (LASIX) 20 MG tablet Take 20 mg by mouth daily.   traMADol (ULTRAM) 50 MG tablet Take 1 tablet (50 mg total) by mouth 3 (three) times daily as needed.   No facility-administered encounter medications on file as of 10/05/2023.    ALLERGIES:  No Known Allergies  LABORATORY DATA:  I have reviewed the labs as listed.  CBC    Component Value Date/Time   WBC 4.0 11/25/2020 0824   RBC 4.26 11/25/2020 0824   HGB 11.5 (L) 11/25/2020 0824   HCT 36.4 11/25/2020 0824   PLT 269 11/25/2020 0824   MCV 85.4 11/25/2020 0824   MCH 27.0 11/25/2020 0824   MCHC 31.6 11/25/2020 0824   RDW 14.0 11/25/2020 0824   LYMPHSABS 0.8 11/25/2020 0824    MONOABS 0.6 11/25/2020 0824   EOSABS 0.2 11/25/2020 0824   BASOSABS 0.0 11/25/2020 0824      Latest Ref Rng & Units 10/08/2020    8:55 AM 12/05/2019    2:45 PM 03/21/2016    2:05 PM  CMP  Glucose 70 - 99 mg/dL 96  401  97   BUN 6 - 20 mg/dL 10  11  6    Creatinine 0.44 - 1.00 mg/dL 0.27  2.53  6.64   Sodium 135 - 145 mmol/L 135  138  139   Potassium 3.5 - 5.1 mmol/L 3.6  4.0  3.7   Chloride 98 - 111 mmol/L 105  108  112   CO2 22 - 32 mmol/L 24  24  24    Calcium 8.9 - 10.3 mg/dL 8.6  8.9  8.5   Total Protein 6.5 - 8.1 g/dL 6.7     Total Bilirubin 0.3 - 1.2 mg/dL 0.6     Alkaline Phos 38 - 126 U/L 42     AST 15 - 41 U/L 22     ALT 0 - 44 U/L 17       DIAGNOSTIC IMAGING:  I have independently reviewed the relevant imaging and discussed with the patient.   WRAP UP:  All questions were answered. The patient knows to call the clinic with any problems, questions or concerns.  Medical decision making: ***  Time spent on visit: I spent *** minutes counseling the patient face to face. The total time spent in the appointment was *** minutes and more than 50% was on counseling.  Carnella Gu[place], PA-C  ***

## 2023-10-05 ENCOUNTER — Inpatient Hospital Stay: Payer: BC Managed Care – PPO

## 2023-10-05 ENCOUNTER — Inpatient Hospital Stay: Payer: BC Managed Care – PPO | Admitting: Physician Assistant

## 2023-10-11 ENCOUNTER — Ambulatory Visit (HOSPITAL_COMMUNITY): Payer: BC Managed Care – PPO

## 2023-11-16 ENCOUNTER — Ambulatory Visit: Payer: BC Managed Care – PPO | Admitting: Adult Health

## 2023-11-29 ENCOUNTER — Inpatient Hospital Stay (HOSPITAL_COMMUNITY): Admission: RE | Admit: 2023-11-29 | Source: Ambulatory Visit

## 2023-12-06 ENCOUNTER — Ambulatory Visit (HOSPITAL_COMMUNITY)

## 2024-01-13 ENCOUNTER — Ambulatory Visit: Admitting: Adult Health

## 2024-01-13 ENCOUNTER — Encounter: Payer: Self-pay | Admitting: Adult Health

## 2024-01-13 VITALS — BP 122/78 | HR 62 | Ht 61.0 in | Wt 232.0 lb

## 2024-01-13 DIAGNOSIS — Z01419 Encounter for gynecological examination (general) (routine) without abnormal findings: Secondary | ICD-10-CM

## 2024-01-13 DIAGNOSIS — F419 Anxiety disorder, unspecified: Secondary | ICD-10-CM

## 2024-01-13 DIAGNOSIS — Z1211 Encounter for screening for malignant neoplasm of colon: Secondary | ICD-10-CM

## 2024-01-13 DIAGNOSIS — Z1331 Encounter for screening for depression: Secondary | ICD-10-CM

## 2024-01-13 LAB — HEMOCCULT GUIAC POC 1CARD (OFFICE): Fecal Occult Blood, POC: NEGATIVE

## 2024-01-13 NOTE — Progress Notes (Signed)
 Patient ID: Jodi Chaney, female   DOB: 1971/05/06, 53 y.o.   MRN: [id number] History of Present Illness: Jodi Chaney is a 53 year old black female, with SO, Jodi Chaney, in for a well woman gyn exam. She has not had a period in 2-3 months and had Hot flash last week. Has family living with her and gets anxious at times.      Component Value Date/Time   DIAGPAP  09/10/2021 1151    - Negative for intraepithelial lesion or malignancy (NILM)   DIAGPAP  10/05/2018 0000    NEGATIVE FOR INTRAEPITHELIAL LESIONS OR MALIGNANCY.   HPVHIGH Negative 09/10/2021 1151   ADEQPAP  09/10/2021 1151    Satisfactory for evaluation; transformation zone component ABSENT.   ADEQPAP  10/05/2018 0000    Satisfactory for evaluation  endocervical/transformation zone component ABSENT.   PCP is Dr Del Favia.   Current Medications, Allergies, Past Medical History, Past Surgical History, Family History and Social History were reviewed in [employer] record.     Review of Systems: Patient denies any headaches, hearing loss, fatigue, blurred vision, shortness of breath, chest pain, abdominal pain, problems with bowel movements, urination, or intercourse(not currently). No joint pain or mood swings.  See HPI for positives    Physical Exam:BP 122/78 (BP Location: Left Arm, Patient Position: Sitting, Cuff Size: Large)   Pulse 62   Ht 5\' 1"  (1.549 m)   Wt 232 lb (105.2 kg)   BMI 43.84 kg/m   General:  Well developed, well nourished, no acute distress Skin:  Warm and dry Neck:  Midline trachea, normal thyroid, good ROM, no lymphadenopathy Lungs; Clear to auscultation bilaterally Breast:  No dominant palpable mass, retraction, or nipple discharge Cardiovascular: Regular rate and rhythm Abdomen:  Soft, non tender, no hepatosplenomegaly Pelvic:  External genitalia is normal in appearance, no lesions.  The vagina is normal in appearance. Urethra has no lesions or masses. The cervix is smooth.  Uterus  is felt to be normal size, shape, and contour.  No adnexal masses or tenderness noted.Bladder is non tender, no masses felt. Rectal: Good sphincter tone, no polyps, or hemorrhoids felt.  Hemoccult negative. Extremities/musculoskeletal:  No swelling or varicosities noted, no clubbing or cyanosis Psych:  No mood changes, alert and cooperative,seems happy AA is 0 Fall risk is low    01/13/2024    8:50 AM 09/10/2021   11:43 AM 08/12/2020   10:03 AM  Depression screen PHQ 2/9  Decreased Interest 0 0 1  Down, Depressed, Hopeless 0 0 1  PHQ - 2 Score 0 0 2  Altered sleeping 3 0 3  Tired, decreased energy 3 0 3  Change in appetite 3 3 3   Feeling bad or failure about yourself  0 1 3  Trouble concentrating 0 0 3  Moving slowly or fidgety/restless 0 0 0  Suicidal thoughts 0 0 0  PHQ-9 Score 9 4 17    Taking buspar    01/13/2024    8:50 AM 09/10/2021   11:44 AM 08/12/2020   10:03 AM  GAD 7 : Generalized Anxiety Score  Nervous, Anxious, on Edge 0 0 0  Control/stop worrying 3 3 3   Worry too much - different things 3 3 3   Trouble relaxing 3 3 3   Restless 0 3 0  Easily annoyed or irritable 0 1 0  Afraid - awful might happen 0 0 3  Total GAD 7 Score 9 13 12       Upstream - 01/13/24 0848  Pregnancy Intention Screening   Does the patient want to become pregnant in the next year? No    Does the patient's partner want to become pregnant in the next year? No    Would the patient like to discuss contraceptive options today? No      Contraception Wrap Up   Current Method Female Sterilization    End Method Female Sterilization    Contraception Counseling Provided No            Examination chaperoned by Alphonso Aschoff LPN   Impression and plan: 1. Encounter for well woman exam with routine gynecological exam (Primary) Pap and physical in 1 year Labs with PCP Get mammogram  Colonoscopy per GI  She is going to work on her weight   2. Encounter for screening fecal occult blood  testing Hemoccult was negative  - POCT occult blood stool  3. Anxiety On Buspar from PCP,but wants to talk with someone, will refer to [facility], in [place] - Ambulatory referral to [facility]

## 2024-02-07 ENCOUNTER — Ambulatory Visit (HOSPITAL_COMMUNITY)
Admission: RE | Admit: 2024-02-07 | Discharge: 2024-02-07 | Disposition: A | Discharge status: Home / Self Care | Source: Ambulatory Visit | Attending: Adult Health | Admitting: Adult Health

## 2024-02-07 DIAGNOSIS — Z1231 Encounter for screening mammogram for malignant neoplasm of breast: Secondary | ICD-10-CM | POA: Diagnosis present

## 2024-02-11 ENCOUNTER — Ambulatory Visit: Payer: Self-pay | Admitting: Adult Health

## 2024-02-29 ENCOUNTER — Encounter: Payer: Self-pay | Admitting: Hematology

## 2024-03-22 ENCOUNTER — Other Ambulatory Visit: Payer: Self-pay

## 2024-03-22 ENCOUNTER — Emergency Department (HOSPITAL_COMMUNITY)

## 2024-03-22 ENCOUNTER — Encounter (HOSPITAL_COMMUNITY): Payer: Self-pay | Admitting: Emergency Medicine

## 2024-03-22 ENCOUNTER — Emergency Department (HOSPITAL_COMMUNITY)
Admission: EM | Admit: 2024-03-22 | Discharge: 2024-03-22 | Disposition: A | Discharge status: Home / Self Care | Attending: Emergency Medicine | Admitting: Emergency Medicine

## 2024-03-22 DIAGNOSIS — Z87891 Personal history of nicotine dependence: Secondary | ICD-10-CM | POA: Insufficient documentation

## 2024-03-22 DIAGNOSIS — Y9355 Activity, bike riding: Secondary | ICD-10-CM | POA: Diagnosis not present

## 2024-03-22 DIAGNOSIS — M25532 Pain in left wrist: Secondary | ICD-10-CM | POA: Insufficient documentation

## 2024-03-22 MED ORDER — IBUPROFEN 400 MG PO TABS
400.0000 mg | ORAL_TABLET | Freq: Once | ORAL | Status: AC
  Administered 2024-03-22: 400 mg via ORAL
  Filled 2024-03-22: qty 1

## 2024-03-22 MED ORDER — ACETAMINOPHEN 500 MG PO TABS
1000.0000 mg | ORAL_TABLET | Freq: Once | ORAL | Status: AC
  Administered 2024-03-22: 1000 mg via ORAL
  Filled 2024-03-22: qty 2
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
## 2024-03-22 NOTE — ED Triage Notes (Signed)
 Pt c/o left hand and wrist pain after falling Sunday.

## 2024-03-22 NOTE — Discharge Instructions (Addendum)
 We evaluated you for your wrist pain. Your x-ray didn't show any fracture.   Because of the location of your pain we have placed you in a splint. You may still have a fracture we can't see on an X-ray.   Please follow up with Dr. Margrette for repeat x-rays in around one week.   Please take Tylenol  (acetaminophen ) and Motrin  (ibuprofen ) for your symptoms at home.  You can take 1000 mg of Tylenol  every 6 hours and 600 mg of Motrin  every 6 hours as needed for your symptoms.  You can take these medicines together as needed, either at the same time, or alternating every 3 hours.

## 2024-07-05 ENCOUNTER — Encounter: Payer: Self-pay | Admitting: Orthopedic Surgery

## 2024-07-05 ENCOUNTER — Telehealth: Payer: Self-pay | Admitting: Orthopedic Surgery

## 2024-07-05 ENCOUNTER — Ambulatory Visit: Admitting: Orthopedic Surgery

## 2024-07-05 VITALS — Ht 61.0 in | Wt 240.0 lb

## 2024-07-05 DIAGNOSIS — M1712 Unilateral primary osteoarthritis, left knee: Secondary | ICD-10-CM

## 2024-07-05 DIAGNOSIS — M170 Bilateral primary osteoarthritis of knee: Secondary | ICD-10-CM | POA: Diagnosis not present

## 2024-07-05 DIAGNOSIS — Z6841 Body Mass Index (BMI) 40.0 and over, adult: Secondary | ICD-10-CM

## 2024-07-05 DIAGNOSIS — M1711 Unilateral primary osteoarthritis, right knee: Secondary | ICD-10-CM

## 2024-07-05 MED ORDER — CELECOXIB 200 MG PO CAPS
200.0000 mg | ORAL_CAPSULE | Freq: Every day | ORAL | 0 refills | Status: AC
Start: 2024-07-05 — End: ?

## 2024-07-05 MED ORDER — TRAMADOL HCL 50 MG PO TABS
50.0000 mg | ORAL_TABLET | Freq: Three times a day (TID) | ORAL | 2 refills | Status: AC | PRN
Start: 2024-07-05 — End: ?

## 2024-07-05 NOTE — Progress Notes (Signed)
    07/05/2024   Chief Complaint  Patient presents with   Knee Pain    Both     No diagnosis found.  What pharmacy do you use ? ___________________________  DOI/DOS/ Date:    Did you get better, worse or no change (Answer below)   Improved/ right  Worse/ left

## 2024-07-05 NOTE — Telephone Encounter (Signed)
 Completed a prior auth request in cover my meds for Tramadol 

## 2024-07-05 NOTE — Progress Notes (Signed)
   Patient: Jodi Chaney           Date of Birth: [birth year]           MRN: [id number] Visit Date: 07/05/2024 Requested by: Shona Norleen PEDLAR, MD [address] [place],  [place] [address] PCP: Shona Norleen PEDLAR, MD  Encounter Diagnoses  Name Primary?   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 Increasing left knee pain  She has taken tramadol  and ibuprofen  for pain she says the tramadol  does not work anymore she would like something just for day-to-day symptoms  However, she has not responded to meloxicam  and ibuprofen  although ibuprofen  does give some relief at times  We discussed her treatment options including the following Continued attempts at weight loss Switch to a different anti-inflammatory We discussed not taking opioids for osteoarthritis pain We recommend injection but it was declined  She will continue to work on weight and we will see her on an as needed symptomatic basis  Meds ordered this encounter  Medications   celecoxib  (CELEBREX ) 200 MG capsule    Sig: Take 1 capsule (200 mg total) by mouth daily.    Dispense:  60 capsule    Refill:  0   traMADol  (ULTRAM ) 50 MG tablet    Sig: Take 1 tablet (50 mg total) by mouth 3 (three) times daily as needed.    Dispense:  30 tablet    Refill:  2     Chief Complaint  Patient presents with   Knee Pain    Both    History noted above  Recommend tramadol  and Celebrex  Focused exam findings:  There were no vitals taken for this visit.    No results found.
